# Patient Record
Sex: Female | Born: 1955 | Race: White | Hispanic: No | Marital: Married | Smoking: Never smoker
Health system: Southern US, Community
[De-identification: ages and names within clinical notes are randomized; demographics above are authoritative.]

## PROBLEM LIST (undated history)

## (undated) DIAGNOSIS — Z86718 Personal history of other venous thrombosis and embolism: Secondary | ICD-10-CM

## (undated) DIAGNOSIS — C50919 Malignant neoplasm of unspecified site of unspecified female breast: Secondary | ICD-10-CM

## (undated) DIAGNOSIS — M179 Osteoarthritis of knee, unspecified: Secondary | ICD-10-CM

## (undated) DIAGNOSIS — N3090 Cystitis, unspecified without hematuria: Secondary | ICD-10-CM

## (undated) DIAGNOSIS — M1990 Unspecified osteoarthritis, unspecified site: Secondary | ICD-10-CM

## (undated) DIAGNOSIS — M1710 Unilateral primary osteoarthritis, unspecified knee: Secondary | ICD-10-CM

## (undated) DIAGNOSIS — D759 Disease of blood and blood-forming organs, unspecified: Secondary | ICD-10-CM

## (undated) DIAGNOSIS — F329 Major depressive disorder, single episode, unspecified: Secondary | ICD-10-CM

## (undated) DIAGNOSIS — F32A Depression, unspecified: Secondary | ICD-10-CM

## (undated) DIAGNOSIS — I739 Peripheral vascular disease, unspecified: Secondary | ICD-10-CM

## (undated) DIAGNOSIS — M47817 Spondylosis without myelopathy or radiculopathy, lumbosacral region: Secondary | ICD-10-CM

## (undated) HISTORY — DX: Spondylosis without myelopathy or radiculopathy, lumbosacral region: M47.817

## (undated) HISTORY — PX: WISDOM TOOTH EXTRACTION: SHX21

## (undated) HISTORY — PX: ABDOMINAL HYSTERECTOMY: SHX81

## (undated) HISTORY — DX: Osteoarthritis of knee, unspecified: M17.9

## (undated) HISTORY — PX: REFRACTIVE SURGERY: SHX103

## (undated) HISTORY — DX: Personal history of other venous thrombosis and embolism: Z86.718

## (undated) HISTORY — DX: Unspecified osteoarthritis, unspecified site: M19.90

## (undated) HISTORY — DX: Unilateral primary osteoarthritis, unspecified knee: M17.10

## (undated) HISTORY — DX: Malignant neoplasm of unspecified site of unspecified female breast: C50.919

---

## 1980-12-11 DIAGNOSIS — N3090 Cystitis, unspecified without hematuria: Secondary | ICD-10-CM

## 1980-12-11 HISTORY — DX: Cystitis, unspecified without hematuria: N30.90

## 1981-09-10 DIAGNOSIS — N159 Renal tubulo-interstitial disease, unspecified: Secondary | ICD-10-CM | POA: Insufficient documentation

## 1992-05-11 DIAGNOSIS — Z86718 Personal history of other venous thrombosis and embolism: Secondary | ICD-10-CM

## 1992-05-11 HISTORY — DX: Personal history of other venous thrombosis and embolism: Z86.718

## 2005-09-10 HISTORY — PX: NOVASURE ABLATION: SHX5394

## 2007-01-09 ENCOUNTER — Other Ambulatory Visit: Admission: RE | Admit: 2007-01-09 | Discharge: 2007-01-09 | Payer: Self-pay | Admitting: *Deleted

## 2007-03-13 ENCOUNTER — Encounter: Admission: RE | Admit: 2007-03-13 | Discharge: 2007-03-13 | Payer: Self-pay | Admitting: *Deleted

## 2008-12-11 HISTORY — PX: PORTACATH PLACEMENT: SHX2246

## 2009-04-15 ENCOUNTER — Encounter: Admission: RE | Admit: 2009-04-15 | Discharge: 2009-04-15 | Payer: Self-pay | Admitting: Family Medicine

## 2009-04-21 ENCOUNTER — Encounter: Admission: RE | Admit: 2009-04-21 | Discharge: 2009-04-21 | Payer: Self-pay | Admitting: Family Medicine

## 2009-04-21 DIAGNOSIS — Z853 Personal history of malignant neoplasm of breast: Secondary | ICD-10-CM | POA: Insufficient documentation

## 2009-04-21 DIAGNOSIS — C50919 Malignant neoplasm of unspecified site of unspecified female breast: Secondary | ICD-10-CM

## 2009-04-21 HISTORY — DX: Malignant neoplasm of unspecified site of unspecified female breast: C50.919

## 2009-04-26 ENCOUNTER — Ambulatory Visit: Payer: Self-pay | Admitting: Oncology

## 2009-04-26 LAB — CBC WITH DIFFERENTIAL/PLATELET
Basophils Absolute: 0 10*3/uL (ref 0.0–0.1)
Eosinophils Absolute: 0.1 10*3/uL (ref 0.0–0.5)
HGB: 14 g/dL (ref 11.6–15.9)
LYMPH%: 21.1 % (ref 14.0–49.7)
MCV: 88.8 fL (ref 79.5–101.0)
MONO%: 4.9 % (ref 0.0–14.0)
NEUT#: 6.7 10*3/uL — ABNORMAL HIGH (ref 1.5–6.5)
Platelets: 286 10*3/uL (ref 145–400)

## 2009-04-27 ENCOUNTER — Encounter: Admission: RE | Admit: 2009-04-27 | Discharge: 2009-04-27 | Payer: Self-pay | Admitting: Family Medicine

## 2009-04-27 LAB — COMPREHENSIVE METABOLIC PANEL
Alkaline Phosphatase: 69 U/L (ref 39–117)
BUN: 10 mg/dL (ref 6–23)
Creatinine, Ser: 0.85 mg/dL (ref 0.40–1.20)
Glucose, Bld: 119 mg/dL — ABNORMAL HIGH (ref 70–99)
Total Bilirubin: 0.7 mg/dL (ref 0.3–1.2)

## 2009-04-27 LAB — CANCER ANTIGEN 27.29: CA 27.29: 18 U/mL (ref 0–39)

## 2009-04-28 ENCOUNTER — Ambulatory Visit: Admission: RE | Admit: 2009-04-28 | Discharge: 2009-04-28 | Payer: Self-pay | Admitting: Oncology

## 2009-04-28 ENCOUNTER — Encounter: Payer: Self-pay | Admitting: Oncology

## 2009-04-28 ENCOUNTER — Ambulatory Visit: Payer: Self-pay | Admitting: Cardiovascular Disease

## 2009-04-29 ENCOUNTER — Ambulatory Visit (HOSPITAL_COMMUNITY): Admission: RE | Admit: 2009-04-29 | Discharge: 2009-04-29 | Payer: Self-pay | Admitting: Oncology

## 2009-05-11 ENCOUNTER — Ambulatory Visit (HOSPITAL_COMMUNITY): Admission: RE | Admit: 2009-05-11 | Discharge: 2009-05-11 | Payer: Self-pay | Admitting: Oncology

## 2009-05-19 LAB — BASIC METABOLIC PANEL
Chloride: 103 mEq/L (ref 96–112)
Creatinine, Ser: 0.81 mg/dL (ref 0.40–1.20)
Potassium: 4.2 mEq/L (ref 3.5–5.3)

## 2009-05-19 LAB — CBC WITH DIFFERENTIAL/PLATELET
BASO%: 3 % — ABNORMAL HIGH (ref 0.0–2.0)
EOS%: 5 % (ref 0.0–7.0)
MCH: 29.6 pg (ref 25.1–34.0)
MCHC: 33.8 g/dL (ref 31.5–36.0)
RDW: 13.3 % (ref 11.2–14.5)
lymph#: 0.9 10*3/uL (ref 0.9–3.3)

## 2009-05-20 ENCOUNTER — Emergency Department (HOSPITAL_COMMUNITY): Admission: EM | Admit: 2009-05-20 | Discharge: 2009-05-21 | Payer: Self-pay | Admitting: Emergency Medicine

## 2009-05-24 ENCOUNTER — Other Ambulatory Visit: Admission: RE | Admit: 2009-05-24 | Discharge: 2009-05-24 | Payer: Self-pay | Admitting: Family Medicine

## 2009-05-25 LAB — CBC WITH DIFFERENTIAL/PLATELET
Basophils Absolute: 0 10*3/uL (ref 0.0–0.1)
Eosinophils Absolute: 0 10*3/uL (ref 0.0–0.5)
HGB: 13.1 g/dL (ref 11.6–15.9)
MCV: 87.9 fL (ref 79.5–101.0)
MONO#: 0.5 10*3/uL (ref 0.1–0.9)
MONO%: 5.5 % (ref 0.0–14.0)
NEUT#: 6.6 10*3/uL — ABNORMAL HIGH (ref 1.5–6.5)
RDW: 13.7 % (ref 11.2–14.5)
WBC: 8.8 10*3/uL (ref 3.9–10.3)

## 2009-05-25 LAB — COMPREHENSIVE METABOLIC PANEL
Albumin: 3.7 g/dL (ref 3.5–5.2)
Alkaline Phosphatase: 73 U/L (ref 39–117)
BUN: 8 mg/dL (ref 6–23)
CO2: 26 mEq/L (ref 19–32)
Calcium: 8.6 mg/dL (ref 8.4–10.5)
Chloride: 105 mEq/L (ref 96–112)
Glucose, Bld: 130 mg/dL — ABNORMAL HIGH (ref 70–99)
Potassium: 3.6 mEq/L (ref 3.5–5.3)
Total Protein: 6.9 g/dL (ref 6.0–8.3)

## 2009-06-02 LAB — CBC WITH DIFFERENTIAL/PLATELET
Basophils Absolute: 0 10*3/uL (ref 0.0–0.1)
EOS%: 0.3 % (ref 0.0–7.0)
Eosinophils Absolute: 0 10*3/uL (ref 0.0–0.5)
HCT: 37 % (ref 34.8–46.6)
HGB: 13.1 g/dL (ref 11.6–15.9)
LYMPH%: 19.4 % (ref 14.0–49.7)
MCH: 31.4 pg (ref 25.1–34.0)
MCV: 88.5 fL (ref 79.5–101.0)
MONO%: 1.3 % (ref 0.0–14.0)
NEUT#: 3.9 10*3/uL (ref 1.5–6.5)
NEUT%: 78.7 % — ABNORMAL HIGH (ref 38.4–76.8)
Platelets: 268 10*3/uL (ref 145–400)
RDW: 13.5 % (ref 11.2–14.5)

## 2009-06-07 ENCOUNTER — Ambulatory Visit: Payer: Self-pay | Admitting: Oncology

## 2009-06-09 LAB — CBC WITH DIFFERENTIAL/PLATELET
Basophils Absolute: 0 10*3/uL (ref 0.0–0.1)
Eosinophils Absolute: 0 10*3/uL (ref 0.0–0.5)
HCT: 35 % (ref 34.8–46.6)
HGB: 12.5 g/dL (ref 11.6–15.9)
MCV: 87.9 fL (ref 79.5–101.0)
MONO%: 7.3 % (ref 0.0–14.0)
NEUT#: 7 10*3/uL — ABNORMAL HIGH (ref 1.5–6.5)
NEUT%: 79.7 % — ABNORMAL HIGH (ref 38.4–76.8)
RDW: 14.9 % — ABNORMAL HIGH (ref 11.2–14.5)
lymph#: 1.1 10*3/uL (ref 0.9–3.3)

## 2009-06-09 LAB — COMPREHENSIVE METABOLIC PANEL
Albumin: 3.8 g/dL (ref 3.5–5.2)
Alkaline Phosphatase: 78 U/L (ref 39–117)
BUN: 12 mg/dL (ref 6–23)
Creatinine, Ser: 0.89 mg/dL (ref 0.40–1.20)
Glucose, Bld: 132 mg/dL — ABNORMAL HIGH (ref 70–99)
Potassium: 3.9 mEq/L (ref 3.5–5.3)

## 2009-06-16 LAB — CBC WITH DIFFERENTIAL/PLATELET
BASO%: 0.8 % (ref 0.0–2.0)
EOS%: 0.5 % (ref 0.0–7.0)
HCT: 34.2 % — ABNORMAL LOW (ref 34.8–46.6)
MCH: 30.8 pg (ref 25.1–34.0)
MCHC: 34.8 g/dL (ref 31.5–36.0)
MONO#: 0.1 10*3/uL (ref 0.1–0.9)
RDW: 15.4 % — ABNORMAL HIGH (ref 11.2–14.5)
WBC: 4.2 10*3/uL (ref 3.9–10.3)
lymph#: 0.4 10*3/uL — ABNORMAL LOW (ref 0.9–3.3)

## 2009-06-22 LAB — CBC WITH DIFFERENTIAL/PLATELET
Basophils Absolute: 0 10*3/uL (ref 0.0–0.1)
EOS%: 0.2 % (ref 0.0–7.0)
Eosinophils Absolute: 0 10*3/uL (ref 0.0–0.5)
HCT: 34.4 % — ABNORMAL LOW (ref 34.8–46.6)
HGB: 12.1 g/dL (ref 11.6–15.9)
MONO#: 0.7 10*3/uL (ref 0.1–0.9)
NEUT#: 3.8 10*3/uL (ref 1.5–6.5)
NEUT%: 65.7 % (ref 38.4–76.8)
RDW: 16.6 % — ABNORMAL HIGH (ref 11.2–14.5)
WBC: 5.7 10*3/uL (ref 3.9–10.3)
lymph#: 1.2 10*3/uL (ref 0.9–3.3)

## 2009-06-22 LAB — COMPREHENSIVE METABOLIC PANEL
AST: 14 U/L (ref 0–37)
Albumin: 4.1 g/dL (ref 3.5–5.2)
BUN: 15 mg/dL (ref 6–23)
CO2: 25 mEq/L (ref 19–32)
Calcium: 8.9 mg/dL (ref 8.4–10.5)
Chloride: 105 mEq/L (ref 96–112)
Glucose, Bld: 89 mg/dL (ref 70–99)
Potassium: 4 mEq/L (ref 3.5–5.3)

## 2009-06-28 ENCOUNTER — Encounter: Admission: RE | Admit: 2009-06-28 | Discharge: 2009-06-28 | Payer: Self-pay | Admitting: Oncology

## 2009-06-30 LAB — CBC WITH DIFFERENTIAL/PLATELET
BASO%: 2 % (ref 0.0–2.0)
Basophils Absolute: 0.1 10*3/uL (ref 0.0–0.1)
EOS%: 0.8 % (ref 0.0–7.0)
HGB: 11.3 g/dL — ABNORMAL LOW (ref 11.6–15.9)
MCH: 30.9 pg (ref 25.1–34.0)
MONO#: 0.2 10*3/uL (ref 0.1–0.9)
RDW: 17 % — ABNORMAL HIGH (ref 11.2–14.5)
WBC: 3.4 10*3/uL — ABNORMAL LOW (ref 3.9–10.3)
lymph#: 0.6 10*3/uL — ABNORMAL LOW (ref 0.9–3.3)

## 2009-07-02 ENCOUNTER — Ambulatory Visit: Payer: Self-pay | Admitting: Oncology

## 2009-07-07 LAB — COMPREHENSIVE METABOLIC PANEL WITH GFR
ALT: 13 U/L (ref 0–35)
AST: 13 U/L (ref 0–37)
Albumin: 4.3 g/dL (ref 3.5–5.2)
Alkaline Phosphatase: 85 U/L (ref 39–117)
BUN: 15 mg/dL (ref 6–23)
CO2: 19 meq/L (ref 19–32)
Calcium: 9.3 mg/dL (ref 8.4–10.5)
Chloride: 102 meq/L (ref 96–112)
Creatinine, Ser: 0.76 mg/dL (ref 0.40–1.20)
Glucose, Bld: 118 mg/dL — ABNORMAL HIGH (ref 70–99)
Potassium: 4 meq/L (ref 3.5–5.3)
Sodium: 135 meq/L (ref 135–145)
Total Bilirubin: 0.6 mg/dL (ref 0.3–1.2)
Total Protein: 7 g/dL (ref 6.0–8.3)

## 2009-07-07 LAB — CBC WITH DIFFERENTIAL/PLATELET
BASO%: 0.4 % (ref 0.0–2.0)
EOS%: 0.1 % (ref 0.0–7.0)
HCT: 35 % (ref 34.8–46.6)
LYMPH%: 2.5 % — ABNORMAL LOW (ref 14.0–49.7)
MCH: 31.4 pg (ref 25.1–34.0)
MCHC: 34.9 g/dL (ref 31.5–36.0)
MCV: 89.9 fL (ref 79.5–101.0)
MONO%: 2.2 % (ref 0.0–14.0)
NEUT%: 94.8 % — ABNORMAL HIGH (ref 38.4–76.8)
Platelets: 234 10*3/uL (ref 145–400)

## 2009-07-16 LAB — CBC WITH DIFFERENTIAL/PLATELET
Basophils Absolute: 0 10*3/uL (ref 0.0–0.1)
EOS%: 0 % (ref 0.0–7.0)
HCT: 35.5 % (ref 34.8–46.6)
HGB: 12.1 g/dL (ref 11.6–15.9)
LYMPH%: 5.8 % — ABNORMAL LOW (ref 14.0–49.7)
MCH: 30.6 pg (ref 25.1–34.0)
MCHC: 34.1 g/dL (ref 31.5–36.0)
MCV: 89.8 fL (ref 79.5–101.0)
NEUT%: 89.9 % — ABNORMAL HIGH (ref 38.4–76.8)
Platelets: 215 10*3/uL (ref 145–400)
lymph#: 1.3 10*3/uL (ref 0.9–3.3)

## 2009-07-28 LAB — CBC WITH DIFFERENTIAL/PLATELET
BASO%: 0.1 % (ref 0.0–2.0)
Basophils Absolute: 0 10*3/uL (ref 0.0–0.1)
EOS%: 0 % (ref 0.0–7.0)
HGB: 11.4 g/dL — ABNORMAL LOW (ref 11.6–15.9)
MCH: 30.2 pg (ref 25.1–34.0)
MCHC: 33.7 g/dL (ref 31.5–36.0)
MCV: 89.4 fL (ref 79.5–101.0)
MONO%: 3.9 % (ref 0.0–14.0)
NEUT%: 92.5 % — ABNORMAL HIGH (ref 38.4–76.8)
RDW: 17.3 % — ABNORMAL HIGH (ref 11.2–14.5)

## 2009-07-28 LAB — COMPREHENSIVE METABOLIC PANEL
AST: 12 U/L (ref 0–37)
Alkaline Phosphatase: 63 U/L (ref 39–117)
BUN: 17 mg/dL (ref 6–23)
Creatinine, Ser: 0.72 mg/dL (ref 0.40–1.20)
Potassium: 4.1 mEq/L (ref 3.5–5.3)

## 2009-08-02 ENCOUNTER — Ambulatory Visit: Payer: Self-pay | Admitting: Oncology

## 2009-08-04 LAB — CBC WITH DIFFERENTIAL/PLATELET
BASO%: 1.6 % (ref 0.0–2.0)
EOS%: 1.4 % (ref 0.0–7.0)
HCT: 33.3 % — ABNORMAL LOW (ref 34.8–46.6)
LYMPH%: 19 % (ref 14.0–49.7)
MCH: 29.8 pg (ref 25.1–34.0)
MCHC: 33.3 g/dL (ref 31.5–36.0)
MONO#: 0.8 10*3/uL (ref 0.1–0.9)
NEUT%: 63.2 % (ref 38.4–76.8)
RBC: 3.73 10*6/uL (ref 3.70–5.45)
WBC: 5.7 10*3/uL (ref 3.9–10.3)
lymph#: 1.1 10*3/uL (ref 0.9–3.3)

## 2009-08-11 LAB — COMPREHENSIVE METABOLIC PANEL WITH GFR
ALT: 15 U/L (ref 0–35)
AST: 12 U/L (ref 0–37)
Albumin: 4.2 g/dL (ref 3.5–5.2)
Alkaline Phosphatase: 75 U/L (ref 39–117)
BUN: 20 mg/dL (ref 6–23)
CO2: 19 meq/L (ref 19–32)
Calcium: 9.1 mg/dL (ref 8.4–10.5)
Chloride: 106 meq/L (ref 96–112)
Creatinine, Ser: 0.71 mg/dL (ref 0.40–1.20)
Glucose, Bld: 147 mg/dL — ABNORMAL HIGH (ref 70–99)
Potassium: 4.3 meq/L (ref 3.5–5.3)
Sodium: 137 meq/L (ref 135–145)
Total Bilirubin: 0.7 mg/dL (ref 0.3–1.2)
Total Protein: 6.9 g/dL (ref 6.0–8.3)

## 2009-08-11 LAB — CBC WITH DIFFERENTIAL/PLATELET
BASO%: 0.1 % (ref 0.0–2.0)
Basophils Absolute: 0 10e3/uL (ref 0.0–0.1)
EOS%: 0 % (ref 0.0–7.0)
Eosinophils Absolute: 0 10e3/uL (ref 0.0–0.5)
HCT: 35.6 % (ref 34.8–46.6)
HGB: 11.9 g/dL (ref 11.6–15.9)
LYMPH%: 3 % — ABNORMAL LOW (ref 14.0–49.7)
MCH: 30.3 pg (ref 25.1–34.0)
MCHC: 33.4 g/dL (ref 31.5–36.0)
MCV: 90.6 fL (ref 79.5–101.0)
MONO#: 0.3 10e3/uL (ref 0.1–0.9)
MONO%: 2.1 % (ref 0.0–14.0)
NEUT#: 13.5 10e3/uL — ABNORMAL HIGH (ref 1.5–6.5)
NEUT%: 94.8 % — ABNORMAL HIGH (ref 38.4–76.8)
Platelets: 199 10e3/uL (ref 145–400)
RBC: 3.93 10e6/uL (ref 3.70–5.45)
RDW: 16.3 % — ABNORMAL HIGH (ref 11.2–14.5)
WBC: 14.3 10e3/uL — ABNORMAL HIGH (ref 3.9–10.3)
lymph#: 0.4 10e3/uL — ABNORMAL LOW (ref 0.9–3.3)

## 2009-08-18 LAB — CBC WITH DIFFERENTIAL/PLATELET
BASO%: 1.4 % (ref 0.0–2.0)
Eosinophils Absolute: 0 10*3/uL (ref 0.0–0.5)
MONO#: 1.3 10*3/uL — ABNORMAL HIGH (ref 0.1–0.9)
MONO%: 17.7 % — ABNORMAL HIGH (ref 0.0–14.0)
NEUT#: 4.9 10*3/uL (ref 1.5–6.5)
RBC: 3.69 10*6/uL — ABNORMAL LOW (ref 3.70–5.45)
RDW: 15.8 % — ABNORMAL HIGH (ref 11.2–14.5)
WBC: 7.2 10*3/uL (ref 3.9–10.3)

## 2009-08-25 LAB — COMPREHENSIVE METABOLIC PANEL
Alkaline Phosphatase: 86 U/L (ref 39–117)
CO2: 20 mEq/L (ref 19–32)
Creatinine, Ser: 0.64 mg/dL (ref 0.40–1.20)
Glucose, Bld: 137 mg/dL — ABNORMAL HIGH (ref 70–99)
Sodium: 136 mEq/L (ref 135–145)
Total Bilirubin: 0.5 mg/dL (ref 0.3–1.2)
Total Protein: 6.8 g/dL (ref 6.0–8.3)

## 2009-08-25 LAB — CBC WITH DIFFERENTIAL/PLATELET
Eosinophils Absolute: 0 10*3/uL (ref 0.0–0.5)
HCT: 35.3 % (ref 34.8–46.6)
LYMPH%: 3.3 % — ABNORMAL LOW (ref 14.0–49.7)
MONO#: 0.3 10*3/uL (ref 0.1–0.9)
NEUT#: 14 10*3/uL — ABNORMAL HIGH (ref 1.5–6.5)
Platelets: 241 10*3/uL (ref 145–400)
RBC: 3.95 10*6/uL (ref 3.70–5.45)
WBC: 14.8 10*3/uL — ABNORMAL HIGH (ref 3.9–10.3)
lymph#: 0.5 10*3/uL — ABNORMAL LOW (ref 0.9–3.3)

## 2009-08-30 ENCOUNTER — Encounter: Admission: RE | Admit: 2009-08-30 | Discharge: 2009-08-30 | Payer: Self-pay | Admitting: Oncology

## 2009-08-30 ENCOUNTER — Ambulatory Visit: Admission: RE | Admit: 2009-08-30 | Discharge: 2009-11-17 | Payer: Self-pay | Admitting: Radiation Oncology

## 2009-09-01 ENCOUNTER — Ambulatory Visit: Payer: Self-pay | Admitting: Oncology

## 2009-09-01 LAB — CBC WITH DIFFERENTIAL/PLATELET
Basophils Absolute: 0.1 10*3/uL (ref 0.0–0.1)
Eosinophils Absolute: 0 10*3/uL (ref 0.0–0.5)
HCT: 34.3 % — ABNORMAL LOW (ref 34.8–46.6)
LYMPH%: 9.3 % — ABNORMAL LOW (ref 14.0–49.7)
MCV: 89.1 fL (ref 79.5–101.0)
MONO#: 1.3 10*3/uL — ABNORMAL HIGH (ref 0.1–0.9)
MONO%: 16.1 % — ABNORMAL HIGH (ref 0.0–14.0)
NEUT#: 6.1 10*3/uL (ref 1.5–6.5)
NEUT%: 73.7 % (ref 38.4–76.8)
Platelets: 228 10*3/uL (ref 145–400)
WBC: 8.3 10*3/uL (ref 3.9–10.3)
nRBC: 0 % (ref 0–0)

## 2009-09-22 ENCOUNTER — Encounter (INDEPENDENT_AMBULATORY_CARE_PROVIDER_SITE_OTHER): Payer: Self-pay | Admitting: Surgery

## 2009-09-22 ENCOUNTER — Ambulatory Visit (HOSPITAL_COMMUNITY): Admission: RE | Admit: 2009-09-22 | Discharge: 2009-09-23 | Payer: Self-pay | Admitting: Surgery

## 2009-09-22 HISTORY — PX: MASTECTOMY: SHX3

## 2009-10-01 ENCOUNTER — Ambulatory Visit: Payer: Self-pay | Admitting: Oncology

## 2009-10-05 LAB — CBC WITH DIFFERENTIAL/PLATELET
Basophils Absolute: 0 10*3/uL (ref 0.0–0.1)
Eosinophils Absolute: 0.2 10*3/uL (ref 0.0–0.5)
HCT: 36.5 % (ref 34.8–46.6)
HGB: 12.3 g/dL (ref 11.6–15.9)
LYMPH%: 20.7 % (ref 14.0–49.7)
MONO#: 0.4 10*3/uL (ref 0.1–0.9)
NEUT#: 3.3 10*3/uL (ref 1.5–6.5)
NEUT%: 66.1 % (ref 38.4–76.8)
Platelets: 265 10*3/uL (ref 145–400)
RBC: 4.1 10*6/uL (ref 3.70–5.45)
WBC: 5 10*3/uL (ref 3.9–10.3)

## 2009-10-05 LAB — COMPREHENSIVE METABOLIC PANEL
CO2: 25 mEq/L (ref 19–32)
Glucose, Bld: 101 mg/dL — ABNORMAL HIGH (ref 70–99)
Sodium: 140 mEq/L (ref 135–145)
Total Bilirubin: 0.6 mg/dL (ref 0.3–1.2)
Total Protein: 6.3 g/dL (ref 6.0–8.3)

## 2009-10-20 LAB — CBC WITH DIFFERENTIAL/PLATELET
BASO%: 0.8 % (ref 0.0–2.0)
EOS%: 4.4 % (ref 0.0–7.0)
LYMPH%: 26.9 % (ref 14.0–49.7)
MCH: 29.7 pg (ref 25.1–34.0)
MCHC: 33.6 g/dL (ref 31.5–36.0)
MCV: 88.3 fL (ref 79.5–101.0)
MONO%: 8.2 % (ref 0.0–14.0)
Platelets: 217 10*3/uL (ref 145–400)
RBC: 4.37 10*6/uL (ref 3.70–5.45)
RDW: 15.1 % — ABNORMAL HIGH (ref 11.2–14.5)

## 2009-11-08 ENCOUNTER — Ambulatory Visit: Payer: Self-pay | Admitting: Oncology

## 2009-11-10 LAB — COMPREHENSIVE METABOLIC PANEL
ALT: 13 U/L (ref 0–35)
AST: 15 U/L (ref 0–37)
Albumin: 4 g/dL (ref 3.5–5.2)
Alkaline Phosphatase: 102 U/L (ref 39–117)
Chloride: 102 mEq/L (ref 96–112)
Potassium: 4.3 mEq/L (ref 3.5–5.3)
Sodium: 139 mEq/L (ref 135–145)
Total Protein: 6.2 g/dL (ref 6.0–8.3)

## 2009-11-10 LAB — CBC WITH DIFFERENTIAL/PLATELET
BASO%: 0.4 % (ref 0.0–2.0)
EOS%: 2.1 % (ref 0.0–7.0)
MCH: 30 pg (ref 25.1–34.0)
MCV: 87.9 fL (ref 79.5–101.0)
MONO%: 9.1 % (ref 0.0–14.0)
NEUT#: 2.6 10*3/uL (ref 1.5–6.5)
RBC: 4.29 10*6/uL (ref 3.70–5.45)
RDW: 15.1 % — ABNORMAL HIGH (ref 11.2–14.5)
lymph#: 0.9 10*3/uL (ref 0.9–3.3)

## 2009-11-17 ENCOUNTER — Ambulatory Visit: Admission: RE | Admit: 2009-11-17 | Discharge: 2009-12-10 | Payer: Self-pay | Admitting: Radiation Oncology

## 2009-11-17 LAB — CBC WITH DIFFERENTIAL/PLATELET
Basophils Absolute: 0 10*3/uL (ref 0.0–0.1)
EOS%: 2.6 % (ref 0.0–7.0)
Eosinophils Absolute: 0.1 10*3/uL (ref 0.0–0.5)
HCT: 38 % (ref 34.8–46.6)
HGB: 12.8 g/dL (ref 11.6–15.9)
MCH: 29.9 pg (ref 25.1–34.0)
NEUT#: 2.5 10*3/uL (ref 1.5–6.5)
NEUT%: 65.6 % (ref 38.4–76.8)
lymph#: 0.8 10*3/uL — ABNORMAL LOW (ref 0.9–3.3)

## 2009-11-24 LAB — CBC WITH DIFFERENTIAL/PLATELET
Basophils Absolute: 0 10*3/uL (ref 0.0–0.1)
EOS%: 2.9 % (ref 0.0–7.0)
Eosinophils Absolute: 0.1 10*3/uL (ref 0.0–0.5)
HCT: 37 % (ref 34.8–46.6)
HGB: 12.7 g/dL (ref 11.6–15.9)
LYMPH%: 21.4 % (ref 14.0–49.7)
MCH: 30.3 pg (ref 25.1–34.0)
MCV: 88.6 fL (ref 79.5–101.0)
MONO%: 12.5 % (ref 0.0–14.0)
NEUT%: 62.5 % (ref 38.4–76.8)
Platelets: 208 10*3/uL (ref 145–400)
RDW: 17.5 % — ABNORMAL HIGH (ref 11.2–14.5)

## 2009-12-01 LAB — CBC WITH DIFFERENTIAL/PLATELET
EOS%: 2.8 % (ref 0.0–7.0)
LYMPH%: 18.8 % (ref 14.0–49.7)
MCH: 31.1 pg (ref 25.1–34.0)
MCV: 88.8 fL (ref 79.5–101.0)
MONO%: 12.2 % (ref 0.0–14.0)
Platelets: 221 10*3/uL (ref 145–400)
RBC: 4.2 10*6/uL (ref 3.70–5.45)
RDW: 18.4 % — ABNORMAL HIGH (ref 11.2–14.5)

## 2009-12-08 ENCOUNTER — Ambulatory Visit: Payer: Self-pay | Admitting: Oncology

## 2009-12-08 ENCOUNTER — Other Ambulatory Visit: Payer: Self-pay | Admitting: Physician Assistant

## 2009-12-08 LAB — COMPREHENSIVE METABOLIC PANEL
ALT: 16 U/L (ref 0–35)
Albumin: 3.7 g/dL (ref 3.5–5.2)
Alkaline Phosphatase: 112 U/L (ref 39–117)
Glucose, Bld: 99 mg/dL (ref 70–99)
Potassium: 4.3 mEq/L (ref 3.5–5.3)
Sodium: 140 mEq/L (ref 135–145)
Total Bilirubin: 0.7 mg/dL (ref 0.3–1.2)
Total Protein: 6.1 g/dL (ref 6.0–8.3)

## 2009-12-08 LAB — CBC WITH DIFFERENTIAL/PLATELET
BASO%: 0.5 % (ref 0.0–2.0)
Eosinophils Absolute: 0.1 10*3/uL (ref 0.0–0.5)
LYMPH%: 13.7 % — ABNORMAL LOW (ref 14.0–49.7)
MCHC: 34.8 g/dL (ref 31.5–36.0)
MCV: 89.8 fL (ref 79.5–101.0)
MONO#: 0.4 10*3/uL (ref 0.1–0.9)
MONO%: 10 % (ref 0.0–14.0)
NEUT#: 3 10*3/uL (ref 1.5–6.5)
Platelets: 206 10*3/uL (ref 145–400)
RBC: 4.1 10*6/uL (ref 3.70–5.45)
RDW: 18.7 % — ABNORMAL HIGH (ref 11.2–14.5)
WBC: 4.1 10*3/uL (ref 3.9–10.3)

## 2009-12-13 ENCOUNTER — Ambulatory Visit: Admission: RE | Admit: 2009-12-13 | Discharge: 2009-12-14 | Payer: Self-pay | Admitting: Radiation Oncology

## 2009-12-15 LAB — CBC WITH DIFFERENTIAL/PLATELET
BASO%: 0.5 % (ref 0.0–2.0)
Basophils Absolute: 0 10*3/uL (ref 0.0–0.1)
HCT: 37.6 % (ref 34.8–46.6)
HGB: 12.9 g/dL (ref 11.6–15.9)
MONO#: 0.3 10*3/uL (ref 0.1–0.9)
NEUT%: 70 % (ref 38.4–76.8)
WBC: 4.2 10*3/uL (ref 3.9–10.3)
lymph#: 0.8 10*3/uL — ABNORMAL LOW (ref 0.9–3.3)

## 2009-12-15 LAB — COMPREHENSIVE METABOLIC PANEL
ALT: 13 U/L (ref 0–35)
Albumin: 4.1 g/dL (ref 3.5–5.2)
BUN: 15 mg/dL (ref 6–23)
CO2: 23 mEq/L (ref 19–32)
Calcium: 8.4 mg/dL (ref 8.4–10.5)
Chloride: 104 mEq/L (ref 96–112)
Creatinine, Ser: 0.8 mg/dL (ref 0.40–1.20)
Potassium: 4 mEq/L (ref 3.5–5.3)

## 2009-12-15 LAB — FOLLICLE STIMULATING HORMONE: FSH: 46.9 m[IU]/mL

## 2009-12-22 LAB — ESTRADIOL, ULTRA SENS

## 2009-12-29 LAB — CBC WITH DIFFERENTIAL/PLATELET
BASO%: 0.5 % (ref 0.0–2.0)
Basophils Absolute: 0 10e3/uL (ref 0.0–0.1)
EOS%: 2.6 % (ref 0.0–7.0)
Eosinophils Absolute: 0.1 10e3/uL (ref 0.0–0.5)
HCT: 37.4 % (ref 34.8–46.6)
HGB: 13 g/dL (ref 11.6–15.9)
LYMPH%: 18.4 % (ref 14.0–49.7)
MCH: 31.6 pg (ref 25.1–34.0)
MCHC: 34.7 g/dL (ref 31.5–36.0)
MCV: 91.1 fL (ref 79.5–101.0)
MONO#: 0.3 10e3/uL (ref 0.1–0.9)
MONO%: 8.5 % (ref 0.0–14.0)
NEUT#: 2.6 10e3/uL (ref 1.5–6.5)
NEUT%: 70 % (ref 38.4–76.8)
Platelets: 224 10e3/uL (ref 145–400)
RBC: 4.1 10e6/uL (ref 3.70–5.45)
RDW: 18.2 % — ABNORMAL HIGH (ref 11.2–14.5)
WBC: 3.7 10e3/uL — ABNORMAL LOW (ref 3.9–10.3)
lymph#: 0.7 10e3/uL — ABNORMAL LOW (ref 0.9–3.3)

## 2010-01-05 LAB — COMPREHENSIVE METABOLIC PANEL
ALT: 16 U/L (ref 0–35)
AST: 17 U/L (ref 0–37)
Alkaline Phosphatase: 107 U/L (ref 39–117)
BUN: 13 mg/dL (ref 6–23)
Creatinine, Ser: 0.8 mg/dL (ref 0.40–1.20)
Total Bilirubin: 0.5 mg/dL (ref 0.3–1.2)

## 2010-01-05 LAB — HYPERCOAGULABLE PANEL, COMPREHENSIVE
Anticardiolipin IgG: <10 GPL U/mL (ref <10)
Anticardiolipin IgM: <10 MPL U/mL (ref <10)
Beta-2 Glyco I IgG: 9 U/mL (ref <=15)
Beta-2-Glycoprotein I IgM: 122 U/mL — ABNORMAL HIGH (ref <=15)
Drvvt confirmation: 1.18 Ratio (ref <1.21)
PTT Lupus Anticoagulant: 52.1 secs — ABNORMAL HIGH (ref 32.0–43.4)
PTTLA 4:1 Mix: 46.6 secs (ref 36.3–48.8)
Protein C, Total: 108 % (ref 70–140)
Protein S Ag, Total: 108 % (ref 70–140)

## 2010-01-05 LAB — LACTATE DEHYDROGENASE: LDH: 143 U/L (ref 94–250)

## 2010-01-24 ENCOUNTER — Ambulatory Visit: Payer: Self-pay | Admitting: Oncology

## 2010-01-26 LAB — CBC WITH DIFFERENTIAL/PLATELET
BASO%: 0.5 % (ref 0.0–2.0)
Eosinophils Absolute: 0.2 10*3/uL (ref 0.0–0.5)
LYMPH%: 18.7 % (ref 14.0–49.7)
MCHC: 34.7 g/dL (ref 31.5–36.0)
MONO#: 0.4 10*3/uL (ref 0.1–0.9)
NEUT#: 3.5 10*3/uL (ref 1.5–6.5)
RBC: 4.14 10*6/uL (ref 3.70–5.45)
RDW: 15.6 % — ABNORMAL HIGH (ref 11.2–14.5)
WBC: 5.1 10*3/uL (ref 3.9–10.3)

## 2010-01-26 LAB — COMPREHENSIVE METABOLIC PANEL
ALT: 27 U/L (ref 0–35)
Albumin: 4.1 g/dL (ref 3.5–5.2)
Alkaline Phosphatase: 120 U/L — ABNORMAL HIGH (ref 39–117)
CO2: 24 mEq/L (ref 19–32)
Glucose, Bld: 101 mg/dL — ABNORMAL HIGH (ref 70–99)
Potassium: 4.4 mEq/L (ref 3.5–5.3)
Sodium: 139 mEq/L (ref 135–145)
Total Bilirubin: 0.7 mg/dL (ref 0.3–1.2)
Total Protein: 6.8 g/dL (ref 6.0–8.3)

## 2010-02-21 ENCOUNTER — Ambulatory Visit: Payer: Self-pay | Admitting: Oncology

## 2010-02-23 LAB — COMPREHENSIVE METABOLIC PANEL
ALT: 15 U/L (ref 0–35)
Albumin: 4.1 g/dL (ref 3.5–5.2)
CO2: 26 mEq/L (ref 19–32)
Calcium: 9.4 mg/dL (ref 8.4–10.5)
Chloride: 103 mEq/L (ref 96–112)
Glucose, Bld: 91 mg/dL (ref 70–99)
Potassium: 4.5 mEq/L (ref 3.5–5.3)
Sodium: 138 mEq/L (ref 135–145)
Total Bilirubin: 0.9 mg/dL (ref 0.3–1.2)
Total Protein: 6.9 g/dL (ref 6.0–8.3)

## 2010-02-23 LAB — CBC WITH DIFFERENTIAL/PLATELET
BASO%: 0.6 % (ref 0.0–2.0)
Eosinophils Absolute: 0.1 10*3/uL (ref 0.0–0.5)
LYMPH%: 18.9 % (ref 14.0–49.7)
MONO#: 0.4 10*3/uL (ref 0.1–0.9)
NEUT#: 3.1 10*3/uL (ref 1.5–6.5)
Platelets: 218 10*3/uL (ref 145–400)
RBC: 4.28 10*6/uL (ref 3.70–5.45)
WBC: 4.4 10*3/uL (ref 3.9–10.3)
lymph#: 0.8 10*3/uL — ABNORMAL LOW (ref 0.9–3.3)

## 2010-03-03 ENCOUNTER — Other Ambulatory Visit: Admission: RE | Admit: 2010-03-03 | Discharge: 2010-03-03 | Payer: Self-pay | Admitting: *Deleted

## 2010-03-14 LAB — COMPREHENSIVE METABOLIC PANEL
Albumin: 4.1 g/dL (ref 3.5–5.2)
Alkaline Phosphatase: 113 U/L (ref 39–117)
BUN: 18 mg/dL (ref 6–23)
CO2: 23 mEq/L (ref 19–32)
Calcium: 8.9 mg/dL (ref 8.4–10.5)
Chloride: 106 mEq/L (ref 96–112)
Glucose, Bld: 98 mg/dL (ref 70–99)
Potassium: 4.1 mEq/L (ref 3.5–5.3)
Sodium: 140 mEq/L (ref 135–145)
Total Protein: 6.6 g/dL (ref 6.0–8.3)

## 2010-03-14 LAB — CBC WITH DIFFERENTIAL/PLATELET
Basophils Absolute: 0 10*3/uL (ref 0.0–0.1)
EOS%: 2.2 % (ref 0.0–7.0)
Eosinophils Absolute: 0.1 10*3/uL (ref 0.0–0.5)
HCT: 36.5 % (ref 34.8–46.6)
HGB: 12.4 g/dL (ref 11.6–15.9)
MCH: 29.5 pg (ref 25.1–34.0)
NEUT#: 2.6 10*3/uL (ref 1.5–6.5)
NEUT%: 63.3 % (ref 38.4–76.8)
RDW: 13 % (ref 11.2–14.5)
lymph#: 1 10*3/uL (ref 0.9–3.3)

## 2010-03-14 LAB — CANCER ANTIGEN 27.29: CA 27.29: 12 U/mL (ref 0–39)

## 2010-03-14 LAB — LACTATE DEHYDROGENASE: LDH: 166 U/L (ref 94–250)

## 2010-03-14 LAB — LUTEINIZING HORMONE: LH: <0.1 m[IU]/mL

## 2010-03-22 LAB — ESTRADIOL, ULTRA SENS

## 2010-03-23 ENCOUNTER — Ambulatory Visit: Payer: Self-pay | Admitting: Oncology

## 2010-04-22 ENCOUNTER — Ambulatory Visit: Payer: Self-pay | Admitting: Oncology

## 2010-05-23 ENCOUNTER — Ambulatory Visit: Payer: Self-pay | Admitting: Oncology

## 2010-06-06 LAB — COMPREHENSIVE METABOLIC PANEL
ALT: 15 U/L (ref 0–35)
AST: 15 U/L (ref 0–37)
Albumin: 4 g/dL (ref 3.5–5.2)
Alkaline Phosphatase: 112 U/L (ref 39–117)
Calcium: 9.1 mg/dL (ref 8.4–10.5)
Chloride: 105 mEq/L (ref 96–112)
Creatinine, Ser: 0.9 mg/dL (ref 0.40–1.20)
Potassium: 4.1 mEq/L (ref 3.5–5.3)

## 2010-06-06 LAB — CBC WITH DIFFERENTIAL/PLATELET
BASO%: 0.5 % (ref 0.0–2.0)
EOS%: 3.5 % (ref 0.0–7.0)
MCH: 30.9 pg (ref 25.1–34.0)
MCHC: 34.7 g/dL (ref 31.5–36.0)
MCV: 89.1 fL (ref 79.5–101.0)
MONO%: 6.9 % (ref 0.0–14.0)
RDW: 15.2 % — ABNORMAL HIGH (ref 11.2–14.5)
lymph#: 0.8 10*3/uL — ABNORMAL LOW (ref 0.9–3.3)

## 2010-06-16 LAB — ESTRADIOL, ULTRA SENS: Estradiol, Ultra Sensitive: 2 pg/mL

## 2010-06-22 ENCOUNTER — Ambulatory Visit: Payer: Self-pay | Admitting: Oncology

## 2010-07-07 ENCOUNTER — Ambulatory Visit (HOSPITAL_BASED_OUTPATIENT_CLINIC_OR_DEPARTMENT_OTHER): Admission: RE | Admit: 2010-07-07 | Discharge: 2010-07-07 | Payer: Self-pay | Admitting: Surgery

## 2010-07-07 HISTORY — PX: PORT-A-CATH REMOVAL: SHX5289

## 2010-10-11 ENCOUNTER — Ambulatory Visit: Payer: Self-pay | Admitting: Oncology

## 2010-10-13 LAB — CBC WITH DIFFERENTIAL/PLATELET
BASO%: 0.5 % (ref 0.0–2.0)
LYMPH%: 21.8 % (ref 14.0–49.7)
MCHC: 33.6 g/dL (ref 31.5–36.0)
MCV: 89.3 fL (ref 79.5–101.0)
MONO%: 7.5 % (ref 0.0–14.0)
NEUT#: 3.3 10*3/uL (ref 1.5–6.5)
Platelets: 226 10*3/uL (ref 145–400)
RBC: 4.32 10*6/uL (ref 3.70–5.45)
RDW: 14.2 % (ref 11.2–14.5)
WBC: 4.8 10*3/uL (ref 3.9–10.3)

## 2010-10-13 LAB — COMPREHENSIVE METABOLIC PANEL
ALT: 23 U/L (ref 0–35)
AST: 23 U/L (ref 0–37)
Albumin: 4.6 g/dL (ref 3.5–5.2)
Alkaline Phosphatase: 121 U/L — ABNORMAL HIGH (ref 39–117)
Potassium: 4.6 mEq/L (ref 3.5–5.3)
Sodium: 140 mEq/L (ref 135–145)
Total Bilirubin: 0.8 mg/dL (ref 0.3–1.2)
Total Protein: 7.1 g/dL (ref 6.0–8.3)

## 2010-11-26 IMAGING — CT CT CHEST W/ CM
1 of 3 series · 13 of 31 positions shown, 17 images · IV contrast (agent unspecified)
Comparison: PET CT done concurrently. Breast MRI 04/27/2009

CT CHEST

CLINICAL DATA: New diagnosis of right breast cancer.  Cough.

CT CHEST, ABDOMEN AND PELVIS WITH CONTRAST
TECHNIQUE: Multidetector CT imaging of the chest, abdomen and
pelvis was performed following the standard protocol during bolus
administration of intravenous contrast.
Contrast: 100 ml 8mnipaque-AVV intravenously.

[Series 2: cap with st · axial · 0.84mm/px · z∈[+1288,+1828]mm · 13 of 126 slices shown, 17 images]
[im 9/126  mediastinal]
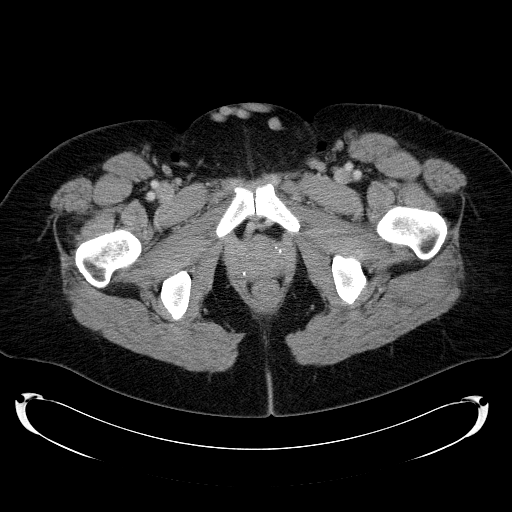
[im 9/126  lung]
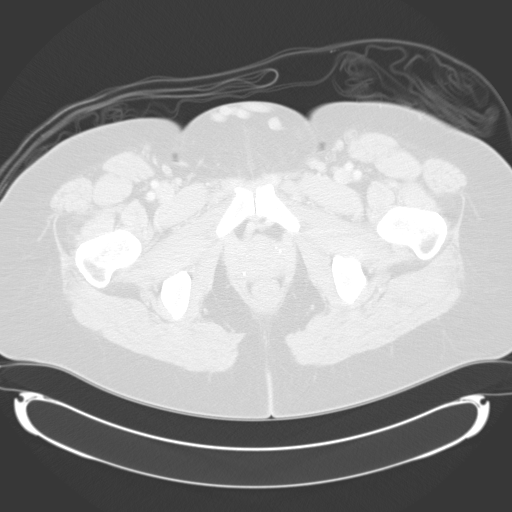
[im 17/126  lung]
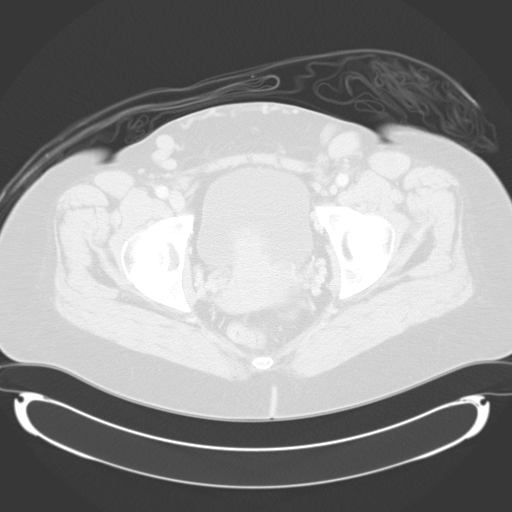
[im 34/126  lung]
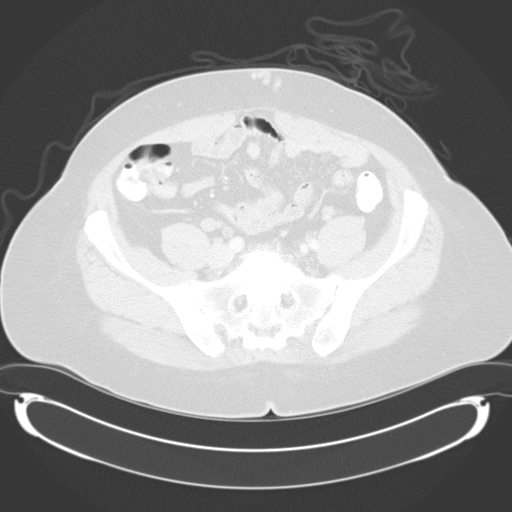
[im 42/126  lung]
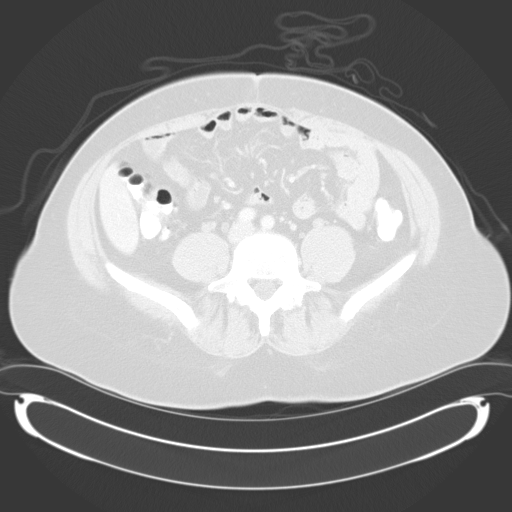
[im 51/126  mediastinal]
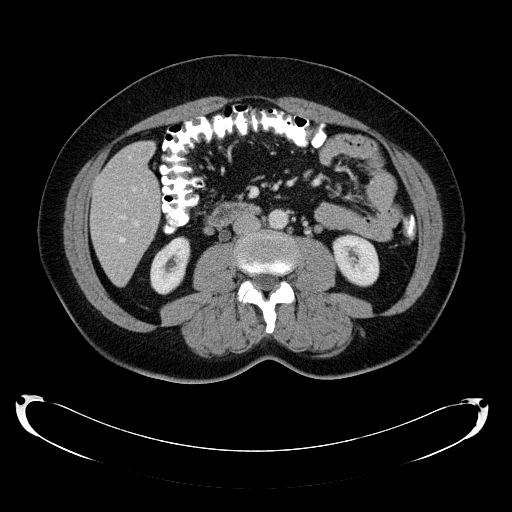
[im 51/126  lung]
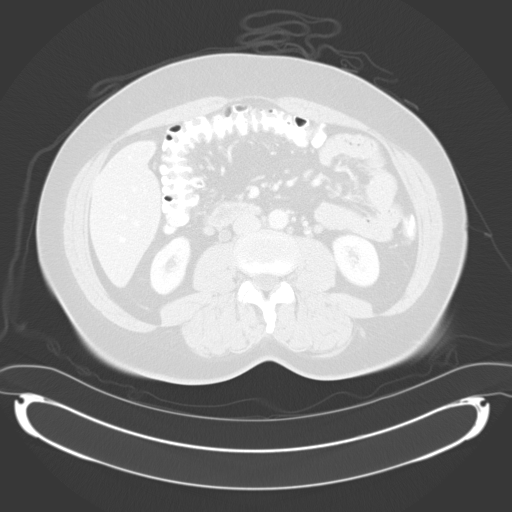
[im 59/126  lung]
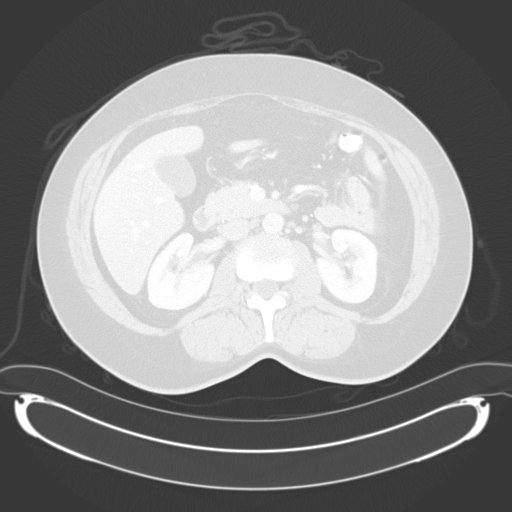
[im 63/126  lung]
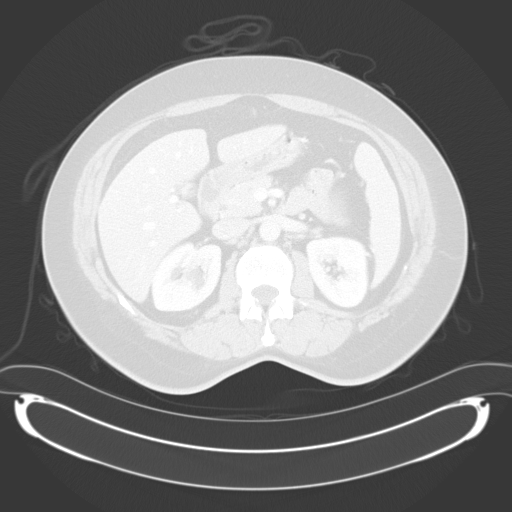
[im 67/126  lung]
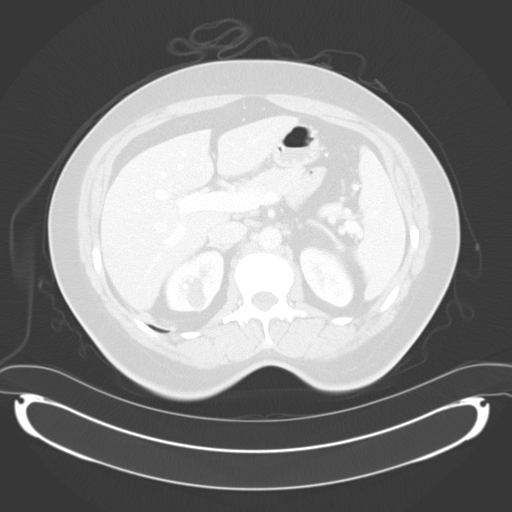
[im 76/126  mediastinal]
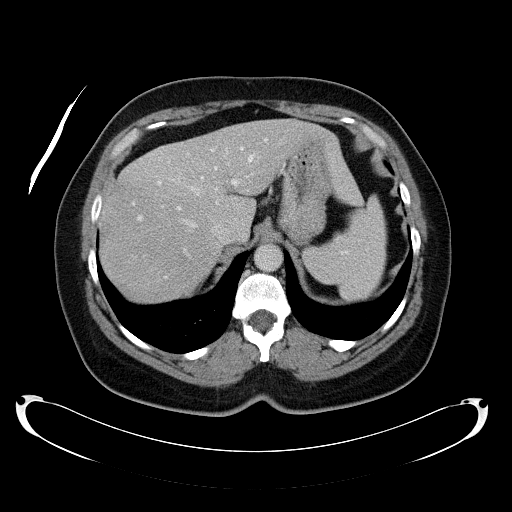
[im 76/126  lung]
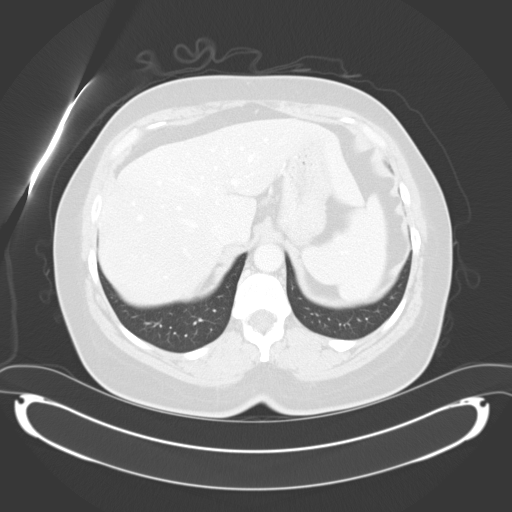
[im 84/126  lung]
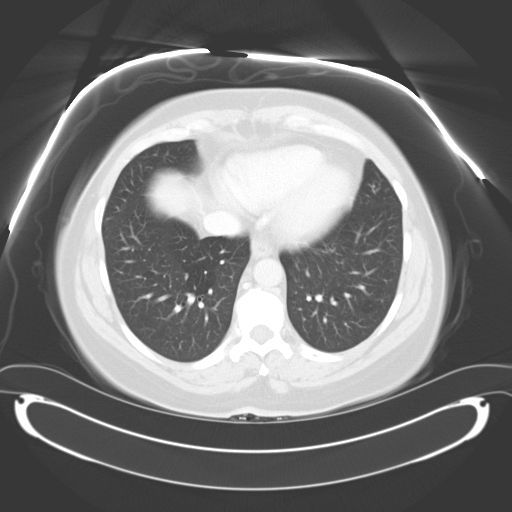
[im 92/126  lung]
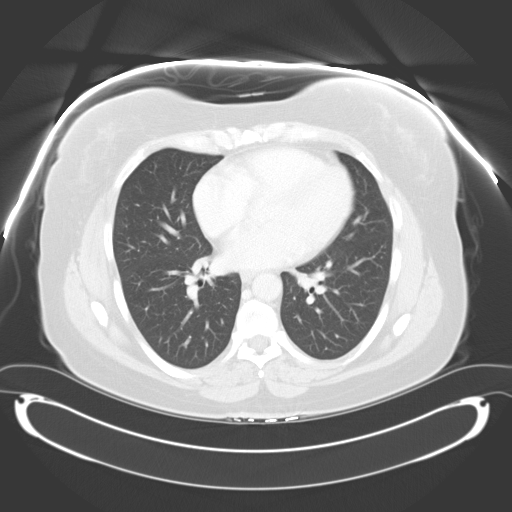
[im 109/126  lung]
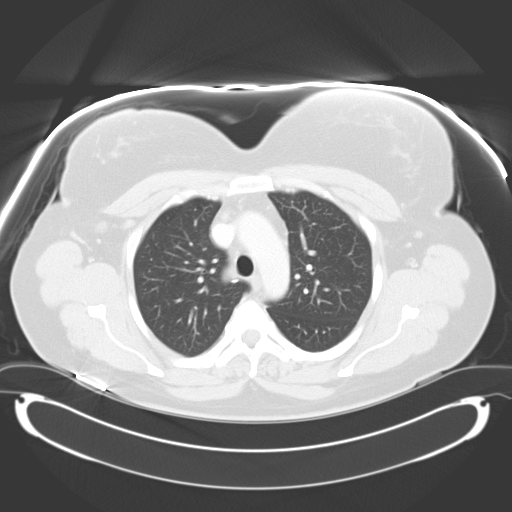
[im 117/126  mediastinal]
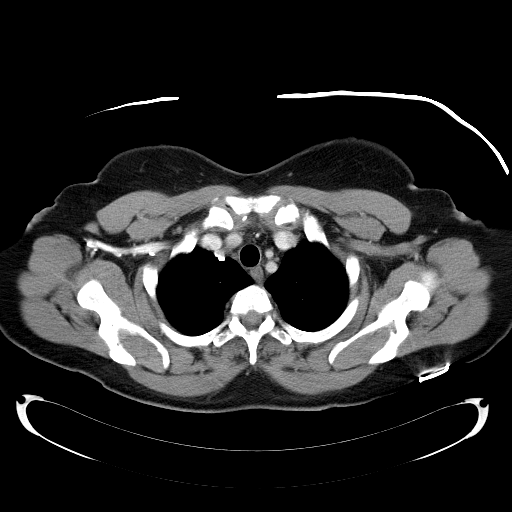
[im 117/126  lung]
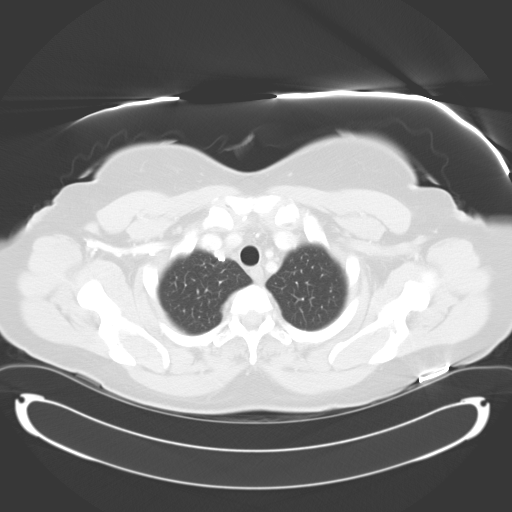

[13 of 31 positions shown; findings below may reference images not displayed]

FINDINGS: Asymmetric density superiorly in the right breast
surrounding the biopsy clip is noted, corresponding with known
breast cancer.  There are several asymmetrically enlarged lymph
nodes in the right axilla.  The largest of these measures 1.2 x
cm on image 19.  These nodes are hypermetabolic on today's PET CT.
There are no enlarged internal mammary, mediastinal or hilar lymph
nodes.

In the left lower lobe, there is a 7 mm ground-glass opacity on
image 33.  There is a 6 mm nodule in the right lower lobe adjacent
to a pulmonary vessel on image 34.  The lungs are otherwise clear.
There is no pleural or pericardial effusion.  No osseous lesions
are identified.
IMPRESSION: 1.  Known right breast cancer with known metastatic disease to
several mildly enlarged right axillary lymph nodes.
2.  No other definite evidence of metastatic disease to the chest.
3.  Indeterminate pulmonary nodule in the right lower lobe and
ground-glass opacity in the left lower lobe.  These are not
hypermetabolic on PET CT and would be atypical for metastatic
disease.  Attention on follow-up is recommended.

CT ABDOMEN
FINDINGS: There is a 6 mm low density lesion in the left hepatic
lobe on image 48.  No other liver lesions are identified.  The
spleen, gallbladder, pancreas and adrenal glands appear normal.
There are nonobstructing renal calculi bilaterally.  In the upper
pole of the right kidney is a simple cyst measuring 1.5 cm in
diameter on image 60.

No enlarged lymph nodes are identified in the upper abdomen.  There
are no suspicious osseous lesions. Degenerative disc disease is
noted at L5-S1.
IMPRESSION: 1.  Indeterminate small lesion in the left hepatic lobe.  This is
likely incidental and can be addressed on follow-up.
2.  No definite evidence of metastatic disease.
3.  Bilateral renal calculi and right renal cyst.

CT PELVIS
FINDINGS: There is a 1.8 cm fibroid posteriorly in the fundus
corresponding with increased metabolic activity on today's PET CT.
There is a primarily low density 3.1 x 3.6 cm right adnexal lesion
on image 103.  Along its anterior inferior margin is a 1.6 cm
component with a perceptible wall corresponding with a mildly
increased metabolic activity on today's PET CT.  This is probably a
collapsing follicle.  No solid adnexal mass identified.  Sigmoid
colon diverticular changes are present.  Numerous varicosities are
noted within the subcutaneous fat of the anterior pelvic wall.  No
acute large vessel occlusion is identified although a left iliac
vein is not well visualized.  This may reflect a congenital variant
or remote DVT.
IMPRESSION: 1.  No evidence of pelvic metastatic disease.
2.  Uterine fibroid and probable collapsing follicle on the right
accounting for hypermetabolic activity on today's PET CT.
3.  Varicosities in the anterior pelvic wall apparently related to
an absent or chronically occluded left iliac vein.

## 2010-12-11 HISTORY — PX: BUNIONECTOMY: SHX129

## 2011-01-04 LAB — SURGICAL PCR SCREEN

## 2011-01-04 LAB — CBC
HCT: 38.3 % (ref 36.0–46.0)
Hemoglobin: 12.7 g/dL (ref 12.0–15.0)
RBC: 4.42 MIL/uL (ref 3.87–5.11)
WBC: 5.7 10*3/uL (ref 4.0–10.5)

## 2011-01-04 LAB — DIFFERENTIAL
Basophils Absolute: 0 10*3/uL (ref 0.0–0.1)
Lymphocytes Relative: 26 % (ref 12–46)
Neutro Abs: 3.6 10*3/uL (ref 1.7–7.7)

## 2011-01-10 ENCOUNTER — Ambulatory Visit (HOSPITAL_COMMUNITY)
Admission: RE | Admit: 2011-01-10 | Discharge: 2011-01-11 | Disposition: A | Discharge status: Home / Self Care | Payer: 59 | Attending: Obstetrics and Gynecology | Admitting: Obstetrics and Gynecology

## 2011-01-10 DIAGNOSIS — C50919 Malignant neoplasm of unspecified site of unspecified female breast: Secondary | ICD-10-CM | POA: Insufficient documentation

## 2011-01-10 DIAGNOSIS — Z4009 Encounter for prophylactic removal of other organ: Secondary | ICD-10-CM | POA: Insufficient documentation

## 2011-01-10 LAB — PREGNANCY, URINE: Preg Test, Ur: NEGATIVE

## 2011-01-11 LAB — CBC
Platelets: 206 10*3/uL (ref 150–400)
RDW: 13.5 % (ref 11.5–15.5)
WBC: 11.2 10*3/uL — ABNORMAL HIGH (ref 4.0–10.5)

## 2011-01-12 NOTE — Op Note (Signed)
NAME:  Mary Bridges, Mary Bridges                ACCOUNT NO.:  [id number]  MEDICAL RECORD NO.:  [id number]          PATIENT TYPE:  OIB  LOCATION:  9318                          FACILITY:  [facility]  PHYSICIAN:  Mary Bridges, M.D.DATE OF BIRTH:  08/08/1956  DATE OF PROCEDURE:  01/10/2011 DATE OF DISCHARGE:                              OPERATIVE REPORT   PREOPERATIVE DIAGNOSIS:  Breast cancer.  POSTOPERATIVE DIAGNOSIS:  Breast cancer.  PROCEDURES: 1. Laparoscopic-assisted vaginal hysterectomy. 2. Bilateral salpingo-oophorectomy.  SURGEON:  Mary Leite L. Vincente Poli, MD  ASSISTANT:  Mary Cairo, MD  ANESTHESIA:  General.  FINDINGS:  Normal pelvis.  There were some varicosities in the pelvis noted.  SPECIMENS:  Uterus, cervix, tubes, and ovaries sent to Pathology.  ESTIMATED BLOOD LOSS:  175 mL.  COMPLICATIONS:  None.  DESCRIPTION OF PROCEDURE:  The patient was taken to the operating room. She was intubated.  She was prepped and draped.  Uterine manipulator was inserted.  A Foley catheter was inserted.  Attention was turned to the abdomen.  A small infraumbilical incision was made with a scalpel and the Veress needle was inserted.  Pneumoperitoneum was performed.  The Veress needle was removed and the trocar was inserted in standard fashion.  The patient was placed in Trendelenburg position.  The bowel and omentum appeared normal.  The uterus was normal size ovaries appeared unremarkable.  A small suprapubic trocar was placed suprapubically under direct visualization.  Attention was turned to the pelvis where a grasper was used to grasp the tube and ovary on the right side.  Ureter was identified well below the IP ligament.  The EnSeal instrument was placed just beneath the ovary.  It was cauterized and cut and this was carried to the round ligament with excellent hemostasis. Normal ureteral peristalsis was noted.  Likewise in standard fashion, the same thing was done on the left side  and we could see the ureter as well, peristalsing afterwards as well.  We then removed the instruments except for the trocars, released the pneumoperitoneum, placed a weighted speculum in to the vagina.  A circumferential incision was made around the cervix.  A small incision was made in the posterior cul-de-sac and one was made in the anterior cul-de-sac.  Curved Heaney clamps were used to clamp the uterosacral cardinal ligaments on either side.  Each pedicle was clamped, cut, suture ligated using 0 Vicryl suture.  We worked our way up the broad ligament.  Each pedicle was clamped, cut, and suture ligated using 0 Vicryl suture with careful attention to stay snug beside the uterus and the cervix.  Once we reached the level of the fundus, the uterus was retroflexed.  The remainder of the broad ligament was clamped on either side.  The specimen was removed and identified the uterus, cervix, tubes, and ovaries.  The pedicles were suture ligated using 0 Vicryl suture in a free tie of 0 Vicryl suture.  The posterior cuff was closed in a running locked stitch using 0 Vicryl suture.  The cuff was closed completely using 0 Vicryl suture.  Attention was then turned to the abdomen where after I  changed my gloves.  We then replaced the pneumoperitoneum, irrigated the pelvis.  Hemostasis was excellent. The pelvis was inspected even under low abdominal pressure.  As the pressure was being released and no bleeding was noted with excellent hemostasis, we then released all the pneumoperitoneum.  We closed the infraumbilical site using 0 Vicryl suture and then we closed the skin with Dermabond.  All sponge, lap, and instrument counts were correct x2. The patient tolerated the procedure very well.     Mary Bridges, M.D.     Mary Bridges  D:  01/10/2011  T:  01/11/2011  Job:  [id number]  Electronically Signed by Mary Bridges M.D. on 01/12/2011 01:50:49 PM

## 2011-01-18 ENCOUNTER — Encounter (HOSPITAL_BASED_OUTPATIENT_CLINIC_OR_DEPARTMENT_OTHER): Payer: 59 | Admitting: Oncology

## 2011-01-18 ENCOUNTER — Other Ambulatory Visit: Payer: Self-pay | Admitting: Physician Assistant

## 2011-01-18 DIAGNOSIS — C50919 Malignant neoplasm of unspecified site of unspecified female breast: Secondary | ICD-10-CM

## 2011-01-18 LAB — COMPREHENSIVE METABOLIC PANEL
ALT: 24 U/L (ref 0–35)
AST: 19 U/L (ref 0–37)
Albumin: 4.1 g/dL (ref 3.5–5.2)
CO2: 24 mEq/L (ref 19–32)
Calcium: 8.9 mg/dL (ref 8.4–10.5)
Chloride: 103 mEq/L (ref 96–112)
Potassium: 4.2 mEq/L (ref 3.5–5.3)
Sodium: 137 mEq/L (ref 135–145)
Total Protein: 6.5 g/dL (ref 6.0–8.3)

## 2011-01-18 LAB — CBC WITH DIFFERENTIAL/PLATELET
BASO%: 1.2 % (ref 0.0–2.0)
HCT: 37.7 % (ref 34.8–46.6)
MCHC: 34.1 g/dL (ref 31.5–36.0)
MONO#: 0.4 10*3/uL (ref 0.1–0.9)
NEUT%: 68.2 % (ref 38.4–76.8)
RDW: 13.9 % (ref 11.2–14.5)
WBC: 6.6 10*3/uL (ref 3.9–10.3)
lymph#: 1.4 10*3/uL (ref 0.9–3.3)

## 2011-01-18 LAB — CANCER ANTIGEN 27.29: CA 27.29: 10 U/mL (ref 0–39)

## 2011-01-18 LAB — LACTATE DEHYDROGENASE: LDH: 188 U/L (ref 94–250)

## 2011-03-16 LAB — URINALYSIS, ROUTINE W REFLEX MICROSCOPIC
Hgb urine dipstick: NEGATIVE
Ketones, ur: NEGATIVE mg/dL
Protein, ur: NEGATIVE mg/dL
Urobilinogen, UA: 0.2 mg/dL (ref 0.0–1.0)

## 2011-03-16 LAB — COMPREHENSIVE METABOLIC PANEL
ALT: 24 U/L (ref 0–35)
AST: 25 U/L (ref 0–37)
Alkaline Phosphatase: 73 U/L (ref 39–117)
CO2: 30 mEq/L (ref 19–32)
Chloride: 107 mEq/L (ref 96–112)
GFR calc non Af Amer: >60 mL/min (ref >60)
Glucose, Bld: 85 mg/dL (ref 70–99)
Potassium: 4.4 mEq/L (ref 3.5–5.1)
Sodium: 141 mEq/L (ref 135–145)

## 2011-03-16 LAB — CBC
Hemoglobin: 11.6 g/dL — ABNORMAL LOW (ref 12.0–15.0)
RBC: 3.82 MIL/uL — ABNORMAL LOW (ref 3.87–5.11)
WBC: 4.9 10*3/uL (ref 4.0–10.5)

## 2011-03-16 LAB — DIFFERENTIAL
Basophils Relative: 1 % (ref 0–1)
Eosinophils Absolute: 0.2 10*3/uL (ref 0.0–0.7)
Eosinophils Relative: 3 % (ref 0–5)
Neutrophils Relative %: 69 % (ref 43–77)

## 2011-03-20 LAB — DIFFERENTIAL
Basophils Relative: 1 % (ref 0–1)
Eosinophils Absolute: 0.1 10*3/uL (ref 0.0–0.7)
Eosinophils Relative: 2 % (ref 0–5)
Lymphs Abs: 1.7 10*3/uL (ref 0.7–4.0)
Monocytes Absolute: 0.3 10*3/uL (ref 0.1–1.0)
Monocytes Relative: 8 % (ref 3–12)
Neutrophils Relative %: 45 % (ref 43–77)

## 2011-03-20 LAB — CK TOTAL AND CKMB (NOT AT ARMC): Total CK: 71 U/L (ref 7–177)

## 2011-03-20 LAB — CBC
HCT: 36.3 % (ref 36.0–46.0)
Hemoglobin: 12.3 g/dL (ref 12.0–15.0)
MCHC: 33.8 g/dL (ref 30.0–36.0)
MCV: 89.6 fL (ref 78.0–100.0)
RBC: 4.05 MIL/uL (ref 3.87–5.11)
WBC: 3.7 10*3/uL — ABNORMAL LOW (ref 4.0–10.5)

## 2011-03-20 LAB — BASIC METABOLIC PANEL
BUN: 16 mg/dL (ref 6–23)
CO2: 26 mEq/L (ref 19–32)
Calcium: 8.6 mg/dL (ref 8.4–10.5)
Creatinine, Ser: 0.97 mg/dL (ref 0.4–1.2)
GFR calc Af Amer: >60 mL/min (ref >60)

## 2011-05-03 ENCOUNTER — Encounter (INDEPENDENT_AMBULATORY_CARE_PROVIDER_SITE_OTHER): Payer: Self-pay | Admitting: Surgery

## 2011-05-22 ENCOUNTER — Other Ambulatory Visit: Payer: Self-pay | Admitting: Physician Assistant

## 2011-05-22 ENCOUNTER — Encounter (HOSPITAL_BASED_OUTPATIENT_CLINIC_OR_DEPARTMENT_OTHER): Payer: 59 | Admitting: Oncology

## 2011-05-22 DIAGNOSIS — C773 Secondary and unspecified malignant neoplasm of axilla and upper limb lymph nodes: Secondary | ICD-10-CM

## 2011-05-22 DIAGNOSIS — C50919 Malignant neoplasm of unspecified site of unspecified female breast: Secondary | ICD-10-CM

## 2011-05-22 DIAGNOSIS — Z5111 Encounter for antineoplastic chemotherapy: Secondary | ICD-10-CM

## 2011-05-22 LAB — CBC WITH DIFFERENTIAL/PLATELET
Basophils Absolute: 0 10*3/uL (ref 0.0–0.1)
Eosinophils Absolute: 0.1 10*3/uL (ref 0.0–0.5)
HCT: 39 % (ref 34.8–46.6)
HGB: 13.2 g/dL (ref 11.6–15.9)
LYMPH%: 23.9 % (ref 14.0–49.7)
MCV: 85.9 fL (ref 79.5–101.0)
MONO#: 0.4 10*3/uL (ref 0.1–0.9)
MONO%: 7.2 % (ref 0.0–14.0)
NEUT#: 3.9 10*3/uL (ref 1.5–6.5)
Platelets: 230 10*3/uL (ref 145–400)
RBC: 4.55 10*6/uL (ref 3.70–5.45)
WBC: 5.9 10*3/uL (ref 3.9–10.3)

## 2011-05-23 LAB — COMPREHENSIVE METABOLIC PANEL
Albumin: 4.2 g/dL (ref 3.5–5.2)
Alkaline Phosphatase: 121 U/L — ABNORMAL HIGH (ref 39–117)
BUN: 11 mg/dL (ref 6–23)
CO2: 25 mEq/L (ref 19–32)
Glucose, Bld: 111 mg/dL — ABNORMAL HIGH (ref 70–99)
Sodium: 142 mEq/L (ref 135–145)
Total Bilirubin: 0.7 mg/dL (ref 0.3–1.2)
Total Protein: 6.9 g/dL (ref 6.0–8.3)

## 2011-05-23 LAB — CANCER ANTIGEN 27.29: CA 27.29: 11 U/mL (ref 0–39)

## 2011-05-23 LAB — VITAMIN D 25 HYDROXY (VIT D DEFICIENCY, FRACTURES): Vit D, 25-Hydroxy: 37 ng/mL (ref 30–89)

## 2011-05-23 LAB — LACTATE DEHYDROGENASE: LDH: 179 U/L (ref 94–250)

## 2011-09-26 ENCOUNTER — Other Ambulatory Visit: Payer: Self-pay | Admitting: Physician Assistant

## 2011-09-26 ENCOUNTER — Encounter (HOSPITAL_BASED_OUTPATIENT_CLINIC_OR_DEPARTMENT_OTHER): Payer: 59 | Admitting: Oncology

## 2011-09-26 DIAGNOSIS — Z23 Encounter for immunization: Secondary | ICD-10-CM

## 2011-09-26 DIAGNOSIS — Z86718 Personal history of other venous thrombosis and embolism: Secondary | ICD-10-CM

## 2011-09-26 DIAGNOSIS — C50919 Malignant neoplasm of unspecified site of unspecified female breast: Secondary | ICD-10-CM

## 2011-09-26 DIAGNOSIS — C773 Secondary and unspecified malignant neoplasm of axilla and upper limb lymph nodes: Secondary | ICD-10-CM

## 2011-09-26 LAB — CBC WITH DIFFERENTIAL/PLATELET
Basophils Absolute: 0 10*3/uL (ref 0.0–0.1)
Eosinophils Absolute: 0.1 10*3/uL (ref 0.0–0.5)
HGB: 13.6 g/dL (ref 11.6–15.9)
MCV: 86.7 fL (ref 79.5–101.0)
MONO#: 0.4 10*3/uL (ref 0.1–0.9)
MONO%: 6.4 % (ref 0.0–14.0)
NEUT#: 4.3 10*3/uL (ref 1.5–6.5)
RBC: 4.59 10*6/uL (ref 3.70–5.45)
RDW: 14.2 % (ref 11.2–14.5)
WBC: 6.2 10*3/uL (ref 3.9–10.3)
lymph#: 1.4 10*3/uL (ref 0.9–3.3)

## 2011-09-27 LAB — COMPREHENSIVE METABOLIC PANEL
AST: 15 U/L (ref 0–37)
Alkaline Phosphatase: 124 U/L — ABNORMAL HIGH (ref 39–117)
BUN: 20 mg/dL (ref 6–23)
Creatinine, Ser: 0.88 mg/dL (ref 0.50–1.10)
Glucose, Bld: 93 mg/dL (ref 70–99)
Total Bilirubin: 0.9 mg/dL (ref 0.3–1.2)

## 2011-09-27 LAB — CANCER ANTIGEN 27.29: CA 27.29: 12 U/mL (ref 0–39)

## 2011-09-27 LAB — VITAMIN D 25 HYDROXY (VIT D DEFICIENCY, FRACTURES): Vit D, 25-Hydroxy: 37 ng/mL (ref 30–89)

## 2011-10-24 ENCOUNTER — Encounter (INDEPENDENT_AMBULATORY_CARE_PROVIDER_SITE_OTHER): Payer: Self-pay | Admitting: General Surgery

## 2011-10-24 ENCOUNTER — Ambulatory Visit (INDEPENDENT_AMBULATORY_CARE_PROVIDER_SITE_OTHER): Payer: Self-pay | Admitting: Surgery

## 2011-11-24 ENCOUNTER — Ambulatory Visit (INDEPENDENT_AMBULATORY_CARE_PROVIDER_SITE_OTHER): Payer: Self-pay | Admitting: Surgery

## 2011-12-01 ENCOUNTER — Ambulatory Visit (INDEPENDENT_AMBULATORY_CARE_PROVIDER_SITE_OTHER): Payer: 59 | Admitting: Surgery

## 2011-12-01 ENCOUNTER — Encounter (INDEPENDENT_AMBULATORY_CARE_PROVIDER_SITE_OTHER): Payer: Self-pay | Admitting: Surgery

## 2011-12-01 VITALS — BP 134/86 | HR 60 | Temp 97.4°F | Resp 18 | Ht 68.0 in | Wt 229.0 lb

## 2011-12-01 DIAGNOSIS — C50919 Malignant neoplasm of unspecified site of unspecified female breast: Secondary | ICD-10-CM

## 2011-12-01 DIAGNOSIS — Z853 Personal history of malignant neoplasm of breast: Secondary | ICD-10-CM

## 2011-12-01 NOTE — Progress Notes (Signed)
NAME: SAKARI RAISANEN       DOB: 10-09-1956           DATE: 12/01/2011       MRN: [id number]   TASHE PURDON is a 55 y.o.Marland Kitchenfemale who presents for routine followup of her Right breast cancer diagnosed in 2010 and treated with neoadjuvant chemo, R MRM and L TM (prophylactic). She has no problems or concerns on either side.She elected no reconstruction and still does not wish to pursue this.  PFSH: She has had no significant changes since the last visit here.  ROS: There have been no significant changes since the last visit here  EXAM: General: The patient is alert, oriented, generally healty appearing, NAD. Mood and affect are normal.  Breasts:  S/P bilateral mastectomy with R radiation therapy. No evidence of recurrence - mild radiation changes, esp around axillary area  Lymphatics: She has no axillary or supraclavicular adenopathy on either side.  Extremities: Full ROM of the surgical side with no lymphedema noted.  Data Reviewed: No new data  Impression: Doing well, with no evidence of recurrent cancer or new cancer  Plan: RTC PRN

## 2011-12-01 NOTE — Patient Instructions (Signed)
We will see you again on an as needed basis.If you feel any nodules or irregular areas in the mastectomy area come back to see me Please call the office at [phone] if you have any questions or concerns. Thank you for allowing [place] to take care of you.

## 2012-01-31 ENCOUNTER — Ambulatory Visit (HOSPITAL_BASED_OUTPATIENT_CLINIC_OR_DEPARTMENT_OTHER): Payer: 59 | Admitting: Oncology

## 2012-01-31 ENCOUNTER — Telehealth: Payer: Self-pay | Admitting: Oncology

## 2012-01-31 VITALS — BP 147/90 | HR 76 | Temp 98.3°F | Ht 68.0 in | Wt 227.0 lb

## 2012-01-31 DIAGNOSIS — C50919 Malignant neoplasm of unspecified site of unspecified female breast: Secondary | ICD-10-CM

## 2012-01-31 DIAGNOSIS — Z79811 Long term (current) use of aromatase inhibitors: Secondary | ICD-10-CM

## 2012-01-31 NOTE — Telephone Encounter (Signed)
gve the pt her aug 2013 appt calendar 

## 2012-01-31 NOTE — Progress Notes (Signed)
Hematology and Oncology Follow Up Visit  Mary Bridges [id number] July 16, 1956 56 y.o. 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 Status post radiation therapy to right chest wall Xeloda for chemosensitization, status post TAH/BSO 2/ 2012 currently on Arimidex 1 mg a day.  Interim History:  There have been no intercurrent illness, hospitalizations or medication changes.  Medications: I have reviewed the patient's current medications.  Allergies:  Allergies  Allergen Reactions  . Codeine   . Erythromycin   . Penicillins     Past Medical History, Surgical history, Social history, and Family History were reviewed and updated.  Review of Systems: Constitutional:  Negative for fever, chills, night sweats, anorexia, weight loss, pain. Cardiovascular: no chest pain or dyspnea on exertion Respiratory: no cough, shortness of breath, or wheezing Neurological: negative Dermatological: negative ENT: negative Skin Gastrointestinal: negative Genito-Urinary: negative Hematological and Lymphatic: negative Breast: negative Musculoskeletal: negative Remaining ROS negative.  Physical Exam: Blood pressure 147/90, pulse 76, temperature 98.3 F (36.8 C), height 5\' 8"  (1.727 m), weight 227 lb (102.967 kg). ECOG:  General appearance: alert, cooperative and appears stated age Head: Normocephalic, without obvious abnormality, atraumatic Neck: no adenopathy, no carotid bruit, no JVD, supple, symmetrical, trachea midline and thyroid not enlarged, symmetric, no tenderness/mass/nodules Lymph nodes: Cervical, supraclavicular, and axillary nodes normal. Cardiac : regular rate and rhythm, no murmurs or gallops Pulmonary:clear to auscultation bilaterally and normal  percussion bilaterally Breasts: inspection negative, no nipple discharge or bleeding, no masses or nodularity palpable and Status post bilateral mastectomy, no evidence of chest wall recurrence Abdomen:soft, non-tender; bowel sounds normal; no masses,  no organomegaly Extremities negative Neuro: alert, oriented, normal speech, no focal findings or movement disorder noted  Lab Results: Lab Results  Component Value Date   WBC 6.2 09/26/2011   HGB 13.6 09/26/2011   HCT 39.8 09/26/2011   MCV 86.7 09/26/2011   PLT 221 09/26/2011     Chemistry      Component Value Date/Time   NA 138 09/26/2011 1233   NA 138 09/26/2011 1233   NA 138 09/26/2011 1233   K 4.0 09/26/2011 1233   K 4.0 09/26/2011 1233   K 4.0 09/26/2011 1233   CL 106 09/26/2011 1233   CL 106 09/26/2011 1233   CL 106 09/26/2011 1233   CO2 19 09/26/2011 1233   CO2 19 09/26/2011 1233   CO2 19 09/26/2011 1233   BUN 20 09/26/2011 1233   BUN 20 09/26/2011 1233   BUN 20 09/26/2011 1233   CREATININE 0.88 09/26/2011 1233   CREATININE 0.88 09/26/2011 1233   CREATININE 0.88 09/26/2011 1233      Component Value Date/Time   CALCIUM 9.7 09/26/2011 1233   CALCIUM 9.7 09/26/2011 1233   CALCIUM 9.7 09/26/2011 1233   ALKPHOS 124* 09/26/2011 1233   ALKPHOS 124* 09/26/2011 1233   ALKPHOS 124* 09/26/2011 1233   AST 15 09/26/2011 1233   AST 15 09/26/2011 1233   AST 15 09/26/2011 1233   ALT 16 09/26/2011 1233   ALT 16 09/26/2011 1233   ALT 16 09/26/2011 1233   BILITOT 0.9 09/26/2011 1233   BILITOT 0.9 09/26/2011 1233   BILITOT 0.9 09/26/2011 1233      .pathology. Radiological Studies: chest X-ray n/a Mammogram n/a Bone density 2/12-  wnl  Impression and Plan: The patient is doing well. She has no clinical evidence of recurrence. She is tolerating Arimidex and taking supplemental vitamin D. I will see her in 6 months time for followup with appropriate lab work.  More than 50% of the visit was spent in patient-related  counselling   Pierce Crane, MD 2/20/20132:37 PM

## 2012-03-11 HISTORY — PX: MENISCUS REPAIR: SHX5179

## 2012-04-08 ENCOUNTER — Other Ambulatory Visit: Payer: Self-pay | Admitting: *Deleted

## 2012-04-08 ENCOUNTER — Other Ambulatory Visit (HOSPITAL_BASED_OUTPATIENT_CLINIC_OR_DEPARTMENT_OTHER): Payer: 59 | Admitting: Lab

## 2012-04-08 DIAGNOSIS — C773 Secondary and unspecified malignant neoplasm of axilla and upper limb lymph nodes: Secondary | ICD-10-CM

## 2012-04-08 DIAGNOSIS — Z853 Personal history of malignant neoplasm of breast: Secondary | ICD-10-CM

## 2012-04-08 DIAGNOSIS — C50919 Malignant neoplasm of unspecified site of unspecified female breast: Secondary | ICD-10-CM

## 2012-04-08 LAB — CBC WITH DIFFERENTIAL/PLATELET
Basophils Absolute: 0.1 10*3/uL (ref 0.0–0.1)
Eosinophils Absolute: 0.1 10*3/uL (ref 0.0–0.5)
HGB: 12.6 g/dL (ref 11.6–15.9)
MONO#: 0.4 10*3/uL (ref 0.1–0.9)
MONO%: 7.5 % (ref 0.0–14.0)
NEUT#: 3.2 10*3/uL (ref 1.5–6.5)
RBC: 4.35 10*6/uL (ref 3.70–5.45)
RDW: 14 % (ref 11.2–14.5)
WBC: 5.7 10*3/uL (ref 3.9–10.3)
lymph#: 1.9 10*3/uL (ref 0.9–3.3)
nRBC: 0 % (ref 0–0)

## 2012-04-08 LAB — COMPREHENSIVE METABOLIC PANEL
ALT: 17 U/L (ref 0–35)
Albumin: 3.8 g/dL (ref 3.5–5.2)
CO2: 27 mEq/L (ref 19–32)
Chloride: 106 mEq/L (ref 96–112)
Potassium: 3.9 mEq/L (ref 3.5–5.3)
Sodium: 140 mEq/L (ref 135–145)
Total Bilirubin: 0.5 mg/dL (ref 0.3–1.2)
Total Protein: 7.4 g/dL (ref 6.0–8.3)

## 2012-04-08 LAB — LACTATE DEHYDROGENASE: LDH: 174 U/L (ref 94–250)

## 2012-08-06 ENCOUNTER — Telehealth: Payer: Self-pay | Admitting: *Deleted

## 2012-08-06 ENCOUNTER — Other Ambulatory Visit (HOSPITAL_BASED_OUTPATIENT_CLINIC_OR_DEPARTMENT_OTHER): Payer: 59 | Admitting: Lab

## 2012-08-06 ENCOUNTER — Ambulatory Visit (HOSPITAL_BASED_OUTPATIENT_CLINIC_OR_DEPARTMENT_OTHER): Payer: 59 | Admitting: Oncology

## 2012-08-06 VITALS — BP 127/80 | HR 80 | Temp 98.1°F | Resp 20 | Ht 68.0 in | Wt 222.0 lb

## 2012-08-06 DIAGNOSIS — C50919 Malignant neoplasm of unspecified site of unspecified female breast: Secondary | ICD-10-CM

## 2012-08-06 DIAGNOSIS — Z853 Personal history of malignant neoplasm of breast: Secondary | ICD-10-CM

## 2012-08-06 DIAGNOSIS — C50419 Malignant neoplasm of upper-outer quadrant of unspecified female breast: Secondary | ICD-10-CM

## 2012-08-06 LAB — COMPREHENSIVE METABOLIC PANEL (CC13)
Albumin: 3.8 g/dL (ref 3.5–5.0)
Alkaline Phosphatase: 113 U/L (ref 40–150)
BUN: 15 mg/dL (ref 7.0–26.0)
Calcium: 9.4 mg/dL (ref 8.4–10.4)
Creatinine: 0.9 mg/dL (ref 0.6–1.1)
Glucose: 126 mg/dl — ABNORMAL HIGH (ref 70–99)
Potassium: 4.1 mEq/L (ref 3.5–5.1)

## 2012-08-06 LAB — CBC WITH DIFFERENTIAL/PLATELET
BASO%: 1.2 % (ref 0.0–2.0)
EOS%: 2 % (ref 0.0–7.0)
HCT: 38.6 % (ref 34.8–46.6)
HGB: 13.1 g/dL (ref 11.6–15.9)
MCH: 30.1 pg (ref 25.1–34.0)
MCHC: 34.1 g/dL (ref 31.5–36.0)
MONO#: 0.4 10*3/uL (ref 0.1–0.9)
RDW: 14.3 % (ref 11.2–14.5)
WBC: 4.9 10*3/uL (ref 3.9–10.3)
lymph#: 1.6 10*3/uL (ref 0.9–3.3)

## 2012-08-06 NOTE — Telephone Encounter (Signed)
Gave patient appointment for 01-2013 

## 2012-08-06 NOTE — Progress Notes (Signed)
Hematology and Oncology Follow Up Visit  Mary Bridges [id number] [birth year] 56 y.o. 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 Status post radiation therapy to right chest wall Xeloda for chemosensitization, status post TAH/BSO 2/ 2012 currently on Arimidex 1 mg a day.  Interim History:  There have been no intercurrent illness, hospitalizations or medication changes. She has some ongoing problems with her left knee and this needs some surgical intervention likely meniscus type issues. She otherwise is doing well. She is on Arimidex and appears to be tolerating it well. She has no other complaints.  Medications: I have reviewed the patient's current medications.  Allergies:  Allergies  Allergen Reactions  . Codeine   . Erythromycin   . Penicillins     Past Medical History, Surgical history, Social history, and Family History were reviewed and updated.  Review of Systems: Constitutional:  Negative for fever, chills, night sweats, anorexia, weight loss, pain. Cardiovascular: no chest pain or dyspnea on exertion Respiratory: no cough, shortness of breath, or wheezing Neurological: negative Dermatological: negative ENT: negative Skin Gastrointestinal: negative Genito-Urinary: negative Hematological and Lymphatic: negative Breast: negative Musculoskeletal: negative Remaining ROS negative.  Physical Exam: Blood pressure 127/80, pulse 80, temperature 98.1 F (36.7 C), resp. rate 20, height 5\' 8"  (1.727 m), weight 222 lb (100.699 kg). ECOG:  General appearance: alert, cooperative and appears stated age Head: Normocephalic, without obvious abnormality, atraumatic Neck: no adenopathy, no carotid bruit, no JVD, supple, symmetrical, trachea  midline and thyroid not enlarged, symmetric, no tenderness/mass/nodules Lymph nodes: Cervical, supraclavicular, and axillary nodes normal. Cardiac : regular rate and rhythm, no murmurs or gallops Pulmonary:clear to auscultation bilaterally and normal percussion bilaterally Breasts: inspection negative, no nipple discharge or bleeding, no masses or nodularity palpable and Status post bilateral mastectomy, no evidence of chest wall recurrence Abdomen:soft, non-tender; bowel sounds normal; no masses,  no organomegaly Extremities negative Neuro: alert, oriented, normal speech, no focal findings or movement disorder noted  Lab Results: Lab Results  Component Value Date   WBC 4.9 08/06/2012   HGB 13.1 08/06/2012   HCT 38.6 08/06/2012   MCV 88.2 08/06/2012   PLT 219 08/06/2012     Chemistry      Component Value Date/Time   NA 139 08/06/2012 1412   NA 140 04/08/2012 1606   K 4.1 08/06/2012 1412   K 3.9 04/08/2012 1606   CL 104 08/06/2012 1412   CL 106 04/08/2012 1606   CO2 27 08/06/2012 1412   CO2 27 04/08/2012 1606   BUN 15.0 08/06/2012 1412   BUN 15 04/08/2012 1606   CREATININE 0.9 08/06/2012 1412   CREATININE 0.85 04/08/2012 1606      Component Value Date/Time   CALCIUM 9.4 08/06/2012 1412   CALCIUM 9.3 04/08/2012 1606   ALKPHOS 113 08/06/2012 1412   ALKPHOS 104 04/08/2012 1606   AST 16 08/06/2012 1412   AST 14 04/08/2012 1606   ALT 17 08/06/2012 1412   ALT 17 04/08/2012 1606   BILITOT 1.00 08/06/2012 1412   BILITOT 0.5 04/08/2012 1606      .pathology. Radiological Studies: chest X-ray n/a Mammogram n/a Bone density 2/12- wnl  Impression and Plan: The patient is doing well. She has no clinical evidence of recurrence. She is tolerating Arimidex and taking  supplemental vitamin D. I will see her in 6 months time for followup with appropriate lab work.  More than 50% of the visit was spent in patient-related counselling   Pierce Crane, MD 8/27/20137:27 PM

## 2012-12-11 HISTORY — PX: GUM SURGERY: SHX658

## 2013-01-10 ENCOUNTER — Telehealth: Payer: Self-pay | Admitting: *Deleted

## 2013-01-10 ENCOUNTER — Encounter: Payer: Self-pay | Admitting: Oncology

## 2013-01-10 NOTE — Telephone Encounter (Signed)
Pt called about her appt and I confirmed 02/04/13 appt w/ pt.  Mailed letter & calendar to pt.

## 2013-02-04 ENCOUNTER — Encounter: Payer: Self-pay | Admitting: Family

## 2013-02-04 ENCOUNTER — Other Ambulatory Visit: Payer: Self-pay | Admitting: Oncology

## 2013-02-04 ENCOUNTER — Telehealth: Payer: Self-pay | Admitting: Oncology

## 2013-02-04 ENCOUNTER — Ambulatory Visit (HOSPITAL_BASED_OUTPATIENT_CLINIC_OR_DEPARTMENT_OTHER): Payer: 59 | Admitting: Family

## 2013-02-04 ENCOUNTER — Ambulatory Visit (HOSPITAL_BASED_OUTPATIENT_CLINIC_OR_DEPARTMENT_OTHER): Payer: 59 | Admitting: Lab

## 2013-02-04 VITALS — BP 138/89 | HR 82 | Temp 98.6°F | Resp 20 | Ht 68.0 in | Wt 227.9 lb

## 2013-02-04 DIAGNOSIS — C50919 Malignant neoplasm of unspecified site of unspecified female breast: Secondary | ICD-10-CM

## 2013-02-04 DIAGNOSIS — Z853 Personal history of malignant neoplasm of breast: Secondary | ICD-10-CM

## 2013-02-04 DIAGNOSIS — C50911 Malignant neoplasm of unspecified site of right female breast: Secondary | ICD-10-CM

## 2013-02-04 DIAGNOSIS — Z79811 Long term (current) use of aromatase inhibitors: Secondary | ICD-10-CM

## 2013-02-04 LAB — CBC WITH DIFFERENTIAL/PLATELET
BASO%: 1 % (ref 0.0–2.0)
Eosinophils Absolute: 0.1 10*3/uL (ref 0.0–0.5)
HCT: 41.3 % (ref 34.8–46.6)
LYMPH%: 31 % (ref 14.0–49.7)
MCHC: 33.2 g/dL (ref 31.5–36.0)
MCV: 85.7 fL (ref 79.5–101.0)
MONO#: 0.4 10*3/uL (ref 0.1–0.9)
NEUT%: 58.8 % (ref 38.4–76.8)
Platelets: 244 10*3/uL (ref 145–400)
WBC: 6 10*3/uL (ref 3.9–10.3)

## 2013-02-04 LAB — COMPREHENSIVE METABOLIC PANEL (CC13)
Albumin: 4 g/dL (ref 3.5–5.0)
BUN: 14.5 mg/dL (ref 7.0–26.0)
Calcium: 10.1 mg/dL (ref 8.4–10.4)
Chloride: 103 mEq/L (ref 98–107)
Glucose: 97 mg/dl (ref 70–99)
Potassium: 4.6 mEq/L (ref 3.5–5.1)

## 2013-02-04 MED ORDER — ANASTROZOLE 1 MG PO TABS: 1.0000 mg | ORAL_TABLET | Freq: Every day | ORAL | Status: DC

## 2013-02-04 NOTE — Progress Notes (Signed)
[facility]  [phone] [phone]  OFFICE PROGRESS NOTE   ID: Mary Bridges   DOB: Apr 24, 1956  MR#: [id number]  MWU#:[id number]   PCP: Mary Manges, FNP GYN: Mary Bridges. Mary Poli, MD SU: Mary Bent, MD   HISTORY OF PRESENT ILLNESS: From Dr. Theron Arista Bridges's new patient evaluation dated 04/26/2009:  "This is a delightful 57 year old woman from [place], [place] here today with her husband for evaluation and treatment of breast cancer.  [name] has been in relatively good health.  She is here today with her husband.  She has not had regular mammogram since about 2007.  She had a screening mammogram followed by a diagnostic right mammogram and right breast ultrasound on 04/21/2009.  Physical exam showed a palpable thickening 12 o'clock position right breast with right nipple retraction.  Ultrasound showed a mass in the right breast 4 cm from the nipple measuring 6.3 x 2.8 x 5.1 cm.  Ultrasound of the right axilla showed at least some enlarged right axillary lymph nodes with attenuated fatty hila.  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  Prognostic panel is pending.  She is due for an MRI scan tomorrow.  She is seeing Dr. Luisa Bridges who has in turn referred her for neoadjuvant therapy." Her subsequent history is as detailed below.   INTERVAL HISTORY: Dr. Darnelle Bridges and I saw Mary Bridges today for follow up of her breast cancer.  She was last seen by Dr. Donnie Bridges on 08/06/2012. The patient is establishing herself with Dr. Darnelle Bridges today.   REVIEW OF SYSTEMS: A 10 point review of systems was completed and is negative except for the patient has experienced bilateral knee pain since early 2013. She denies any other symptomatology.   PAST MEDICAL HISTORY: Past Medical History  Diagnosis Date  . Kidney infection  09/1981    during first pregnancy  . History of blood clots 05/1992    Left leg  . Breast cancer, Right 04/21/2009     PAST SURGICAL HISTORY: Past Surgical History  Procedure Laterality Date  . Mastectomy  09/22/09    Bilateral  . Wisdom tooth extraction      in her teens  . Novasure ablation  09/2005  . Eye surgery      Lasix  . Port-a-cath removal  07/07/10  . Abdominal hysterectomy    . Meniscus repair Right 03/2012    FAMILY HISTORY Family History  Problem Relation Age of Onset  . Cancer Father     Prostate, bone     GYNECOLOGIC HISTORY: G5, P4, menarche age 62, menopausal.  SOCIAL HISTORY: The patient is from [place] but previously lived in [place], [place] prior to coming to [place] where they live for over 5 years.  Her husband is a Emergency planning/management officer for [employer].  They have four children.  She has a daughter living in [place] and son living in [place], another child living in [place], [place].  The patient is a [employer].    ADVANCED DIRECTIVES: Not on file  HEALTH MAINTENANCE: History  Substance Use Topics  . Smoking status: Never Smoker   . Smokeless tobacco: Never Used  . Alcohol Use: No     Colonoscopy:  The patient reports her last colonoscopy in 2008.  PAP:  The patient reports her last Pap smear was in 2011.  Bone  density: Not on file  Lipid panel: Not on file  Allergies  Allergen Reactions  . Codeine   . Erythromycin   . Penicillins     Current Outpatient Prescriptions  Medication Sig Dispense Refill  . anastrozole (ARIMIDEX) 1 MG tablet Take 1 tablet (1 mg total) by mouth daily.  30 tablet  24  . Calcium Carbonate-Vitamin D (CALCIUM + D PO) Take 600 mg by mouth daily.        . citalopram (CELEXA) 20 MG tablet Take 20 mg by mouth daily.       . fexofenadine (ALLEGRA ALLERGY) 180 MG tablet Take 180 mg by mouth daily.        . Glucosamine-Chondroitin (OSTEO BI-FLEX REGULAR STRENGTH PO) Take by mouth  daily.        Marland Kitchen rOPINIRole (REQUIP) 0.25 MG tablet 0.5 mg daily.        No current facility-administered medications for this visit.    OBJECTIVE: Filed Vitals:   02/04/13 1114  BP: 138/89  Pulse: 82  Temp: 98.6 F (37 C)  Resp: 20     Body mass index is 34.66 kg/(m^2).       ECOG FS: And Grade 2 - Symptomatic, but fully ambulatory           General appearance: Alert, cooperative, well nourished, no apparent distress Head: Normocephalic, without obvious abnormality, atraumatic Eyes: Conjunctivae/corneas clear, PERRLA, EOMI, right eye strabismus Nose: Nares, septum and mucosa are normal, no drainage or sinus tenderness. Neck: No adenopathy, supple, symmetrical, trachea midline, thyroid not enlarged, no tenderness Resp: Clear to auscultation bilaterally Cardio: Regular rate and rhythm, S1, S2 normal, no murmur, click, rub or gallop Breasts: Breasts are surgically absent bilaterally, well-healed surgical scars, no lymphadenopathy, no axilla fullness, radiation changes, benign breast exam GI: Soft, distended, non-tender, hypoactive bowel sounds, no organomegaly Extremities: Extremities normal, atraumatic, no cyanosis or edema Lymph nodes: Cervical, supraclavicular, and axillary nodes normal Neurologic: Grossly normal   LAB RESULTS: Lab Results  Component Value Date   WBC 6.0 02/04/2013   NEUTROABS 3.5 02/04/2013   HGB 13.7 02/04/2013   HCT 41.3 02/04/2013   MCV 85.7 02/04/2013   PLT 244 02/04/2013      Chemistry      Component Value Date/Time   NA 140 02/04/2013 1234   NA 140 04/08/2012 1606   K 4.6 02/04/2013 1234   K 3.9 04/08/2012 1606   CL 103 02/04/2013 1234   CL 106 04/08/2012 1606   CO2 28 02/04/2013 1234   CO2 27 04/08/2012 1606   BUN 14.5 02/04/2013 1234   BUN 15 04/08/2012 1606   CREATININE 0.9 02/04/2013 1234   CREATININE 0.85 04/08/2012 1606      Component Value Date/Time   CALCIUM 10.1 02/04/2013 1234   CALCIUM 9.3 04/08/2012 1606   ALKPHOS 147 02/04/2013 1234    ALKPHOS 104 04/08/2012 1606   AST 15 02/04/2013 1234   AST 14 04/08/2012 1606   ALT 17 02/04/2013 1234   ALT 17 04/08/2012 1606   BILITOT 0.90 02/04/2013 1234   BILITOT 0.5 04/08/2012 1606       Lab Results  Component Value Date   LABCA2 14 02/04/2013    Urinalysis    Component Value Date/Time   COLORURINE YELLOW 09/20/2009 0955   APPEARANCEUR CLEAR 09/20/2009 0955   LABSPEC 1.019 09/20/2009 0955   PHURINE 6.0 09/20/2009 0955   GLUCOSEU NEGATIVE 09/20/2009 0955   HGBUR NEGATIVE 09/20/2009 0955   BILIRUBINUR NEGATIVE 09/20/2009 0955  KETONESUR NEGATIVE 09/20/2009 0955   PROTEINUR NEGATIVE 09/20/2009 0955   UROBILINOGEN 0.2 09/20/2009 0955   NITRITE NEGATIVE 09/20/2009 0955   LEUKOCYTESUR NEGATIVE MICROSCOPIC NOT DONE ON URINES WITH NEGATIVE PROTEIN, BLOOD, LEUKOCYTES, NITRITE, OR GLUCOSE <1000 mg/dL. 09/20/2009 0955    STUDIES: No results found.  ASSESSMENT: 57 y.o. [employer], [place] woman:  1. Routine screening mammogram on 04/15/2009 that showed a possible mass in the right breast additional evaluation indicated with no specific malignancy in the left breast.  2. Ultrasound-guided biopsy of the mass at the 12:00 position in the right breast and enlarged right axillary lymph node on 04/21/2009. Biopsy showed invasive mammary carcinoma minute static carcinoma in the lymph node.  3. MRI of the bilateral breasts with and without contrast on 04/27/2009 showed a 5.0 x 4.8 x 3.5 cm abnormal enhancement in the upper inner and upper outer quadrants of the right breast with multiple prominent right axillary lymph nodes. No evidence of malignancy in the left breast  4. The patient received neoadjuvant chemotherapy with 4 cycles of FEC from 05/12/2009 through 06/24/1999 and followed by Taxotere for 4 cycles from 07/07/2009 through 08/25/2009. The patient also received Lupron from 12/29/2009 through 05/25/2010 monthly and Lupron from 07/20/2010 through 04/16/2011 showed well every  3 months.  5. 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 6 of 15 lymph nodes were positive for metastatic carcinoma.  ER 83%, PR 95%, Ki-67 19%, HER-2/neu no amplification with a ratio of 1.17.  6. The patient underwent radiation therapy from 10/25/2009 through 12/01/2009.  7. The patient has been taking Arimidex since 12/2009.  8. The patient underwent TAH/BSO on 01/2011.   PLAN: Mary Bridges is doing well from a breast cancer point of view. She was given a prescription for Arimidex today #30 with 24 refills. The plan is for her to complete 5 years of Arimidex. The patient's next bone density exam due in April 2014 and her next mammogram is due after 08/31/2013.  We plan to see the patient again in 6 months at which time we will check a CBC, CMP, vitamin D level and CA 27.29 (at the patient's request). The patient was encouraged to participate in water aerobics to help her bilateral knee pain.   All questions were answered.  The patient was encouraged to contact us in the interim with any problems, questions or concerns.   Larina Bras, NP-C 02/05/2013, 7:36 PM

## 2013-02-04 NOTE — Patient Instructions (Addendum)
Please contact us at [phone] if you have any questions or concerns. 

## 2013-02-04 NOTE — Telephone Encounter (Signed)
, °

## 2013-02-07 ENCOUNTER — Other Ambulatory Visit: Payer: 59 | Admitting: Lab

## 2013-02-07 ENCOUNTER — Ambulatory Visit: Payer: 59 | Admitting: Oncology

## 2013-04-08 ENCOUNTER — Other Ambulatory Visit: Payer: Self-pay | Admitting: Obstetrics and Gynecology

## 2013-05-12 ENCOUNTER — Telehealth: Payer: Self-pay | Admitting: *Deleted

## 2013-05-13 ENCOUNTER — Ambulatory Visit (HOSPITAL_BASED_OUTPATIENT_CLINIC_OR_DEPARTMENT_OTHER): Payer: 59 | Admitting: Oncology

## 2013-05-13 DIAGNOSIS — Z853 Personal history of malignant neoplasm of breast: Secondary | ICD-10-CM

## 2013-05-13 DIAGNOSIS — N6459 Other signs and symptoms in breast: Secondary | ICD-10-CM

## 2013-05-13 DIAGNOSIS — C50919 Malignant neoplasm of unspecified site of unspecified female breast: Secondary | ICD-10-CM

## 2013-05-13 DIAGNOSIS — C773 Secondary and unspecified malignant neoplasm of axilla and upper limb lymph nodes: Secondary | ICD-10-CM

## 2013-05-13 NOTE — Progress Notes (Signed)
[facility]  [phone] [phone]  OFFICE PROGRESS NOTE   ID: Mary Bridges   DOB: October 20, 1956  MR#: [id number]  ION#:[id number]   PCP: Claude Manges, FNP GYN: Stann Mainland. Vincente Poli, MD SU: Cyndia Bent, MD   HISTORY OF PRESENT ILLNESS: From Dr. Theron Arista Rubin's new patient evaluation dated 04/26/2009:  "This is a delightful 57 year old woman from [place], [place] here today with her husband for evaluation and treatment of breast cancer.  Ms. Lovejoy has been in relatively good health.  She is here today with her husband.  She has not had regular mammogram since about 2007.  She had a screening mammogram followed by a diagnostic right mammogram and right breast ultrasound on 04/21/2009.  Physical exam showed a palpable thickening 12 o'clock position right breast with right nipple retraction.  Ultrasound showed a mass in the right breast 4 cm from the nipple measuring 6.3 x 2.8 x 5.1 cm.  Ultrasound of the right axilla showed at least some enlarged right axillary lymph nodes with attenuated fatty hila.  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  Prognostic panel is pending.  She is due for an MRI scan tomorrow.  She is seeing Dr. Luisa Hart who has in turn referred her for neoadjuvant therapy."   Her subsequent history is as detailed below.   INTERVAL HISTORY: Mary Bridges was worked in today on an emergency basis because she called with concerns regarding a rash on her right chest wall.  REVIEW OF SYSTEMS: She has not been having any unusual headaches, visual changes, nausea, vomiting, dizziness, or balance problems. Denies cough, phlegm production, or pleurisy. Has not been any change in bowel or bladder habits. Denies pain, itching, or burning, particularly in the area in question. She just noticed a rash, she says,  earlier today. A detailed review of systems was otherwise noncontributory   PAST MEDICAL HISTORY: Past Medical History  Diagnosis Date  . Kidney infection 09/1981    during first pregnancy  . History of blood clots 05/1992    Left leg  . Breast cancer, Right 04/21/2009     PAST SURGICAL HISTORY: Past Surgical History  Procedure Laterality Date  . Mastectomy  09/22/09    Bilateral  . Wisdom tooth extraction      in her teens  . Novasure ablation  09/2005  . Eye surgery      Lasix  . Port-a-cath removal  07/07/10  . Abdominal hysterectomy    . Meniscus repair Right 03/2012    FAMILY HISTORY Family History  Problem Relation Age of Onset  . Cancer Father     Prostate, bone     GYNECOLOGIC HISTORY: G5, P4, menarche age 44, menopausal.  SOCIAL HISTORY: The patient is from [place] but previously lived in [place] prior to coming to [place]. 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 Her husband is a Emergency planning/management officer for [employer].  They have four children.  She has a daughter living in [place] who does art therapy, a son living in [place] who works in Consulting civil engineer, a son living in [place] who has a Best boy in pharmacy, and a son living at home ("he delivers beats is".     ADVANCED DIRECTIVES: Not on file  HEALTH MAINTENANCE: History  Substance Use Topics  . Smoking status: Never Smoker   .  Smokeless tobacco: Never Used  . Alcohol Use: No     Colonoscopy:  The patient reports her last colonoscopy in 2008.  PAP:  The patient reports her last Pap smear was in 2011.  Bone density: Not on file  Lipid panel: Not on file  Allergies  Allergen Reactions  . Codeine   . Erythromycin   . Penicillins     Current Outpatient Prescriptions  Medication Sig Dispense Refill  . anastrozole (ARIMIDEX) 1 MG tablet Take 1 tablet (1 mg total) by mouth daily.  30 tablet  24  . Calcium Carbonate-Vitamin D (CALCIUM + D PO) Take 600 mg by mouth daily.        .  citalopram (CELEXA) 20 MG tablet Take 20 mg by mouth daily.       . fexofenadine (ALLEGRA ALLERGY) 180 MG tablet Take 180 mg by mouth daily.        . Glucosamine-Chondroitin (OSTEO BI-FLEX REGULAR STRENGTH PO) Take by mouth daily.        Marland Kitchen rOPINIRole (REQUIP) 0.25 MG tablet 0.5 mg daily.        No current facility-administered medications for this visit.    OBJECTIVE: There were no vitals filed for this visit.   There is no weight on file to calculate BMI.       ECOG FS:0  Sclerae unicteric Oropharynx clear No cervical or supraclavicular adenopathy Lungs no rales or rhonchi Heart regular rate and rhythm Abd benign MSK no focal spinal tenderness, no peripheral edema Neuro: nonfocal, well oriented, anxious affect Breasts: The patient is status post bilateral mastectomies. The "rash" she has developed in the lateral aspect of the chest wall are telangiectasias. There is no mass, there is no skin breakdown, and there are no findings of concern. The left chest wall is benign.   LAB RESULTS: Lab Results  Component Value Date   WBC 6.0 02/04/2013   NEUTROABS 3.5 02/04/2013   HGB 13.7 02/04/2013   HCT 41.3 02/04/2013   MCV 85.7 02/04/2013   PLT 244 02/04/2013      Chemistry      Component Value Date/Time   NA 140 02/04/2013 1234   NA 140 04/08/2012 1606   K 4.6 02/04/2013 1234   K 3.9 04/08/2012 1606   CL 103 02/04/2013 1234   CL 106 04/08/2012 1606   CO2 28 02/04/2013 1234   CO2 27 04/08/2012 1606   BUN 14.5 02/04/2013 1234   BUN 15 04/08/2012 1606   CREATININE 0.9 02/04/2013 1234   CREATININE 0.85 04/08/2012 1606      Component Value Date/Time   CALCIUM 10.1 02/04/2013 1234   CALCIUM 9.3 04/08/2012 1606   ALKPHOS 147 02/04/2013 1234   ALKPHOS 104 04/08/2012 1606   AST 15 02/04/2013 1234   AST 14 04/08/2012 1606   ALT 17 02/04/2013 1234   ALT 17 04/08/2012 1606   BILITOT 0.90 02/04/2013 1234   BILITOT 0.5 04/08/2012 1606       Lab Results  Component Value Date   LABCA2 14 02/04/2013     Urinalysis    Component Value Date/Time   COLORURINE YELLOW 09/20/2009 0955   APPEARANCEUR CLEAR 09/20/2009 0955   LABSPEC 1.019 09/20/2009 0955   PHURINE 6.0 09/20/2009 0955   GLUCOSEU NEGATIVE 09/20/2009 0955   HGBUR NEGATIVE 09/20/2009 0955   BILIRUBINUR NEGATIVE 09/20/2009 0955   KETONESUR NEGATIVE 09/20/2009 0955   PROTEINUR NEGATIVE 09/20/2009 0955   UROBILINOGEN 0.2 09/20/2009 0955   NITRITE NEGATIVE 09/20/2009 0955  LEUKOCYTESUR NEGATIVE MICROSCOPIC NOT DONE ON URINES WITH NEGATIVE PROTEIN, BLOOD, LEUKOCYTES, NITRITE, OR GLUCOSE <1000 mg/dL. 09/20/2009 0955    STUDIES: No results found.  ASSESSMENT: 57 y.o. [employer]  Woman:  (1) status post right breast and right axillary lymph node biopsy 04/21/2009 both positive for a clinical T3, N1-2, stage III invasive mammary carcinoma with lobular features, grade 2,  (2) treated neoadjuvanly with 4 cycles of FEC from 05/12/2009 through 06/24/1999 followed by Taxotere for 4 cycles from 07/07/2009 through 08/25/2009. The patient also received Lupron from 12/29/2009 through 05/25/2010 monthly and then from 07/20/2010 through 04/16/2011 given every 3 months.  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  6. The patient underwent radiation therapy from 10/25/2009 through 12/01/2009.  7. The patient has been taking Arimidex since 12/2009.  8. The patient underwent TAH/BSO on 01/2011.   PLAN: Luckily the "rash" in the right chest wall area turned out to be a false alarm. I reassured Sherisa that there is no evidence of recurrence. She is already set up for followup here in August. She knows to call for any problems that may develop before the next visit.  Lowella Dell, MD  05/13/2013, 7:07 PM

## 2013-07-02 ENCOUNTER — Telehealth: Payer: Self-pay | Admitting: Oncology

## 2013-07-02 NOTE — Telephone Encounter (Signed)
pt needed to r/s appt.Marland KitchenMarland KitchenDone

## 2013-07-28 ENCOUNTER — Telehealth: Payer: Self-pay | Admitting: Oncology

## 2013-07-28 ENCOUNTER — Other Ambulatory Visit: Payer: Self-pay | Admitting: Emergency Medicine

## 2013-07-28 ENCOUNTER — Other Ambulatory Visit (HOSPITAL_BASED_OUTPATIENT_CLINIC_OR_DEPARTMENT_OTHER): Payer: 59 | Admitting: Lab

## 2013-07-28 ENCOUNTER — Ambulatory Visit (HOSPITAL_BASED_OUTPATIENT_CLINIC_OR_DEPARTMENT_OTHER): Payer: 59 | Admitting: Family

## 2013-07-28 ENCOUNTER — Encounter: Payer: Self-pay | Admitting: Family

## 2013-07-28 VITALS — BP 135/82 | HR 81 | Temp 98.3°F | Resp 18 | Ht 68.0 in | Wt 229.9 lb

## 2013-07-28 DIAGNOSIS — Z853 Personal history of malignant neoplasm of breast: Secondary | ICD-10-CM

## 2013-07-28 DIAGNOSIS — C50219 Malignant neoplasm of upper-inner quadrant of unspecified female breast: Secondary | ICD-10-CM

## 2013-07-28 DIAGNOSIS — R05 Cough: Secondary | ICD-10-CM

## 2013-07-28 DIAGNOSIS — R059 Cough, unspecified: Secondary | ICD-10-CM

## 2013-07-28 DIAGNOSIS — C50419 Malignant neoplasm of upper-outer quadrant of unspecified female breast: Secondary | ICD-10-CM

## 2013-07-28 DIAGNOSIS — Z170 Estrogen receptor positive status [ER+]: Secondary | ICD-10-CM

## 2013-07-28 DIAGNOSIS — C773 Secondary and unspecified malignant neoplasm of axilla and upper limb lymph nodes: Secondary | ICD-10-CM

## 2013-07-28 DIAGNOSIS — M25559 Pain in unspecified hip: Secondary | ICD-10-CM

## 2013-07-28 DIAGNOSIS — C50911 Malignant neoplasm of unspecified site of right female breast: Secondary | ICD-10-CM

## 2013-07-28 LAB — CBC WITH DIFFERENTIAL/PLATELET
Basophils Absolute: 0.1 10*3/uL (ref 0.0–0.1)
Eosinophils Absolute: 0.1 10*3/uL (ref 0.0–0.5)
HCT: 40.1 % (ref 34.8–46.6)
HGB: 13.6 g/dL (ref 11.6–15.9)
LYMPH%: 30.2 % (ref 14.0–49.7)
MONO#: 0.5 10*3/uL (ref 0.1–0.9)
NEUT%: 57.6 % (ref 38.4–76.8)
Platelets: 222 10*3/uL (ref 145–400)
WBC: 5.3 10*3/uL (ref 3.9–10.3)
lymph#: 1.6 10*3/uL (ref 0.9–3.3)

## 2013-07-28 LAB — COMPREHENSIVE METABOLIC PANEL (CC13)
Albumin: 3.8 g/dL (ref 3.5–5.0)
Alkaline Phosphatase: 118 U/L (ref 40–150)
BUN: 14.6 mg/dL (ref 7.0–26.0)
CO2: 23 mEq/L (ref 22–29)
Calcium: 9.3 mg/dL (ref 8.4–10.4)
Chloride: 107 mEq/L (ref 98–109)
Glucose: 102 mg/dl (ref 70–140)
Potassium: 4.4 mEq/L (ref 3.5–5.1)
Sodium: 140 mEq/L (ref 136–145)
Total Protein: 7.5 g/dL (ref 6.4–8.3)

## 2013-07-28 NOTE — Telephone Encounter (Signed)
, °

## 2013-07-28 NOTE — Patient Instructions (Addendum)
Please contact us at [phone] if you have any questions or concerns.  Please continue to do well and enjoy life!!!  Get plenty of rest, drink plenty of water, exercise daily (walking), eat a balanced diet.  Continue to take vitamin D and Calcium daily.  Take all medications as prescribed.   Results for orders placed in visit on 07/28/13 (from the past 24 hour(s))  CBC WITH DIFFERENTIAL     Status: None   Collection Time    07/28/13 10:43 AM      Result Value Range   WBC 5.3  3.9 - 10.3 10e3/uL   NEUT# 3.1  1.5 - 6.5 10e3/uL   HGB 13.6  11.6 - 15.9 g/dL   HCT 16.1  09.6 - 04.5 %   Platelets 222  145 - 400 10e3/uL   MCV 86.3  79.5 - 101.0 fL   MCH 29.3  25.1 - 34.0 pg   MCHC 33.9  31.5 - 36.0 g/dL   RBC 4.09  8.11 - 9.14 10e6/uL   RDW 14.5  11.2 - 14.5 %   lymph# 1.6  0.9 - 3.3 10e3/uL   MONO# 0.5  0.1 - 0.9 10e3/uL   Eosinophils Absolute 0.1  0.0 - 0.5 10e3/uL   Basophils Absolute 0.1  0.0 - 0.1 10e3/uL   NEUT% 57.6  38.4 - 76.8 %   LYMPH% 30.2  14.0 - 49.7 %   MONO% 8.7  0.0 - 14.0 %   EOS% 2.4  0.0 - 7.0 %   BASO% 1.1  0.0 - 2.0 %   Narrative:    Performed At:  [facility]               [address]               [place], [place] [address]  COMPREHENSIVE METABOLIC PANEL (CC13)     Status: None   Collection Time    07/28/13 10:44 AM      Result Value Range   Sodium 140  136 - 145 mEq/L   Potassium 4.4  3.5 - 5.1 mEq/L   Chloride 107  98 - 109 mEq/L   CO2 23  22 - 29 mEq/L   Glucose 102  70 - 140 mg/dl   BUN 62.1  7.0 - 30.8 mg/dL   Creatinine 0.8  0.6 - 1.1 mg/dL   Total Bilirubin 6.57  0.20 - 1.20 mg/dL   Alkaline Phosphatase 118  40 - 150 U/L   AST 20  5 - 34 U/L   ALT 25  0 - 55 U/L   Total Protein 7.5  6.4 - 8.3 g/dL   Albumin 3.8  3.5 - 5.0 g/dL   Calcium 9.3  8.4 - 84.6 mg/dL   Narrative:    Note: New Reference ranges.Performed At:  [facility]               [address]               [place], [place] [address]

## 2013-07-28 NOTE — Progress Notes (Signed)
[facility]  Telephone:([phone] [phone]  OFFICE PROGRESS NOTE   ID: [name]   DOB: 02-Dec-1956  MR#: [id number]  JYN#:[id number]   PCP: Mary Bridges, M.D. GYNStann Bridges. Mary Poli, MD SU: Mary Bent, MD   HISTORY OF PRESENT ILLNESS: From Dr. Theron Arista Bridges's new patient evaluation dated 04/26/2009: "This is a delightful 57 year old woman [place] here today with her husband for evaluation and treatment of breast cancer.  Ms. Mary Bridges has been in relatively good health.  She is here today with her husband.  She has not had regular mammogram since about 2007.  She had a screening mammogram followed by a diagnostic right mammogram and right breast ultrasound on 04/21/2009.  Physical exam showed a palpable thickening 12 o'clock position right breast with right nipple retraction.  Ultrasound showed a mass in the right breast 4 cm from the nipple measuring 6.3 x 2.8 x 5.1 cm.  Ultrasound of the right axilla showed at least some enlarged right axillary lymph nodes with attenuated fatty hila.  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  Prognostic panel is pending.  She is due for an MRI scan tomorrow.  She is seeing Dr. Luisa Hart who has in turn referred her for neoadjuvant therapy." Her subsequent history is as detailed below.   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  She was last seen by our office on 05/13/2013.  Since her last office visit she has been doing relatively well.  REVIEW OF SYSTEMS: A 10 point review of systems was completed and is negative except Mary Bridges states that she has ongoing bilateral lower extremity pain in her legs that has persisted for over a year.  The pain is worse in the  morning and when standing for long periods of time.  The bilateral lower extremity pain is relieved with NSAIDs.  The patient has a history of arthritis in her knees.  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  The patient states she gets short of breath with exertion in humid weather.  She continues to have mild vaginal dryness and hot flashes.  This further states she has a productive cough with white mucous is as persisted for over 2 years.  She denies any unusual headaches, visual changes, nausea, vomiting, dizziness, or balance problems. Denies pleurisy. Has not been any change in bowel or bladder habits. Denies itching, or burning, particularly in the area in question.  Her right chest rash has resolved.  A detailed review of systems was otherwise noncontributory.   PAST MEDICAL HISTORY: Past Medical History  Diagnosis Date  . Kidney infection 09/1981    during first pregnancy  . History of blood clots 05/1992    Left leg  . Breast cancer, Right 04/21/2009  . Arthritis     Knees     PAST SURGICAL HISTORY: Past Surgical History  Procedure Laterality Date  . Mastectomy  09/22/09    Bilateral  . Wisdom tooth extraction      in her teens  . Novasure ablation  09/2005  . Eye surgery      Lasix  . Port-a-cath removal  07/07/10  . Abdominal hysterectomy    .  Meniscus repair Right 03/2012  . Gum surgery  2014    Gum Graft    FAMILY HISTORY Family History  Problem Relation Age of Onset  . Cancer Father     Prostate, bone  . Stroke Mother      GYNECOLOGIC HISTORY: G5, P4, one stillbirth, menarche age 51, age of parity 78, menopausal since receiving chemotherapy in 2010.   SOCIAL HISTORY: The patient is from [place] but previously lived in [place] prior to coming to [place]. 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 Her husband is a Emergency planning/management officer for [employer].  They have four children.  She has a daughter living in [place] who does art therapy, a son living in [place] who works in Consulting civil engineer, a son living in [place] who has a doctorate in pharmacy, and a son living at home who delivers pizza.  In her spare time she enjoys gardening and spending time with her pets.   ADVANCED DIRECTIVES: Not on file  HEALTH MAINTENANCE: History  Substance Use Topics  . Smoking status: Never Smoker   . Smokeless tobacco: Never Used  . Alcohol Use: No     Colonoscopy:  The patient reports her last colonoscopy in 2008.  PAP:  The patient reports her last Pap smear was in Spring 2014 with Dr. Vincente Bridges.  Bone density: The patient states she had a bone density scan with Dr. Vincente Bridges in the Spring of 2014 that showed osteopenia.  Lipid panel: Not on file  Allergies  Allergen Reactions  . Codeine   . Erythromycin   . Penicillins     Current Outpatient Prescriptions  Medication Sig Dispense Refill  . anastrozole (ARIMIDEX) 1 MG tablet Take 1 tablet (1 mg total) by mouth daily.  30 tablet  24  . Calcium Carbonate-Vitamin D (CALCIUM + D PO) Take 600 mg by mouth daily.        . citalopram (CELEXA) 20 MG tablet Take 20 mg by mouth daily.       . diclofenac (VOLTAREN) 75 MG EC tablet Take 75 mg by mouth 2 (two) times daily.      . fexofenadine (ALLEGRA ALLERGY) 180 MG tablet Take 180 mg by mouth daily.        . Glucosamine-Chondroitin (OSTEO BI-FLEX REGULAR STRENGTH PO) Take by mouth daily.        Marland Kitchen rOPINIRole (REQUIP) 0.25 MG tablet 0.5 mg daily.        No current facility-administered medications for this visit.    OBJECTIVE: Filed Vitals:   07/28/13 1105  BP: 135/82  Pulse: 81  Temp: 98.3 F (36.8 C)  Resp: 18     Body mass index is 34.96 kg/(m^2).      ECOG FS: 1 - Symptomatic, but fully ambulatory   General appearance: Alert, cooperative, well nourished, no apparent distress  Head: Normocephalic, without obvious abnormality,  atraumatic  Eyes: Conjunctivae/corneas clear, PERRLA, EOMI, eye strabismus Nose: Nares, septum and mucosa are normal, no drainage or sinus tenderness.  Neck: No adenopathy, supple, symmetrical, trachea midline, thyroid not enlarged, no tenderness  Resp: Clear to auscultation bilaterally  Cardio: Regular rate and rhythm, S1, S2 normal, no murmur, click, rub or gallop  Breasts: Deferred  GI: Soft, distended, non-tender, hypoactive bowel sounds, no organomegaly  Extremities: Extremities normal, atraumatic, no cyanosis or edema  Lymph nodes: Cervical, supraclavicular, and axillary nodes normal  Neurologic: Grossly normal    LAB RESULTS: Lab Results  Component Value  Date   WBC 5.3 07/28/2013   NEUTROABS 3.1 07/28/2013   HGB 13.6 07/28/2013   HCT 40.1 07/28/2013   MCV 86.3 07/28/2013   PLT 222 07/28/2013      Chemistry      Component Value Date/Time   NA 140 07/28/2013 1044   NA 140 04/08/2012 1606   K 4.4 07/28/2013 1044   K 3.9 04/08/2012 1606   CL 103 02/04/2013 1234   CL 106 04/08/2012 1606   CO2 23 07/28/2013 1044   CO2 27 04/08/2012 1606   BUN 14.6 07/28/2013 1044   BUN 15 04/08/2012 1606   CREATININE 0.8 07/28/2013 1044   CREATININE 0.85 04/08/2012 1606      Component Value Date/Time   CALCIUM 9.3 07/28/2013 1044   CALCIUM 9.3 04/08/2012 1606   ALKPHOS 118 07/28/2013 1044   ALKPHOS 104 04/08/2012 1606   AST 20 07/28/2013 1044   AST 14 04/08/2012 1606   ALT 25 07/28/2013 1044   ALT 17 04/08/2012 1606   BILITOT 0.69 07/28/2013 1044   BILITOT 0.5 04/08/2012 1606       Lab Results  Component Value Date   LABCA2 14 02/04/2013    Urinalysis    Component Value Date/Time   COLORURINE YELLOW 09/20/2009 0955   APPEARANCEUR CLEAR 09/20/2009 0955   LABSPEC 1.019 09/20/2009 0955   PHURINE 6.0 09/20/2009 0955   GLUCOSEU NEGATIVE 09/20/2009 0955   HGBUR NEGATIVE 09/20/2009 0955   BILIRUBINUR NEGATIVE 09/20/2009 0955   KETONESUR NEGATIVE 09/20/2009 0955   PROTEINUR NEGATIVE 09/20/2009 0955    UROBILINOGEN 0.2 09/20/2009 0955   NITRITE NEGATIVE 09/20/2009 0955   LEUKOCYTESUR NEGATIVE MICROSCOPIC NOT DONE ON URINES WITH NEGATIVE PROTEIN, BLOOD, LEUKOCYTES, NITRITE, OR GLUCOSE <1000 mg/dL. 09/20/2009 0955    STUDIES: No results found.  ASSESSMENT: 57 y.o. [employer] woman:  (1) Status post right breast and right axillary lymph node biopsy 04/21/2009 both positive for a clinical T3, N1-2, stage III invasive mammary carcinoma with lobular features, grade 2,  (2) bilateral breast MRI on 04/27/2009 showed the right breast was smaller than the left. There was asymmetric multicentric non mass-like plateau type enhancement in the upper right breast, involving both the upper inner and upper outer quadrants. Biopsy clip artifact was noted within this area of asymmetric enhancement. The abnormal enhancement was estimated to measure 5.0 x 4.8 x 3.5 cm (transverse x AP x craniocaudal span).  The area of enhancement did not involve the pectoralis muscle. There were no suspicious areas of enhancement in the inferior right breast to suggest malignancy. There were no suspicious areas of enhancement in the left breast to suggest malignancy. There were multiple prominent right axillary lymph nodes. One of these had previously been biopsied and showed metastatic carcinoma. The largest lymph node identified, which demonstrated complete loss of the fatty hilum measured 1.1 x 2.0 cm in axial diameter. Left axillary lymph nodes had normal MRI appearances. No internal mammary chain lymphadenopathy was identified (clinical stage IIIA, T3 N1).  (3) Treated neoadjuvanly with 4 cycles of FEC (5-FU/epirubicin/Cytoxan) from 05/12/2009 through 06/24/1999 followed by Taxotere for 4 cycles from 07/07/2009 through 08/25/2009. The patient also received Lupron from 12/29/2009 through 05/25/2010 monthly and then from 07/20/2010 through 04/16/2011 given every 3 months.  (4) Status post bilateral mastectomy on 09/22/2009 for  a ypT2 ypN2, stage IIIA invasive ductal carcinoma of the right breast, grade 2,  estrogen receptor 83%, progesterone receptor 95%, Ki-67 19%, HER-2/neu no amplification with a ratio of 1.17.  (6)  Radiation therapy from 10/25/2009 through 12/01/2009.  (7) The patient has been taking Arimidex since 12/2009.  (8) The patient underwent TAH/BSO in 01/2011.  (9) Productive cough x 2 years  (10) Chronic bilateral knee pain   PLAN:  We will schedule Mrs. Snyders for diagnostic 2 view chest x-ray for productive cough and for a bilateral breast MRI with contrast in 08/2013.  She will continue antiestrogen therapy with Anastrozole 1 mg by mouth daily, with planned therapy to continue until 12/2014.  Mrs. Cassedy was encouraged to speak with her orthopedist regarding bilateral knee pain.  During her earlier office visit this year she was encouraged to participate in water aerobics to possibly help relieve bilateral knee pain.  We plan to see her again in 01/2014 at which time we will check laboratories of CBC, CMP, LDH, vitamin D level and CA 27.29 (at her request).    Larina Bras, NP 07/28/2013, 6:36 PM

## 2013-08-04 ENCOUNTER — Ambulatory Visit: Payer: 59 | Admitting: Family

## 2013-08-04 ENCOUNTER — Telehealth: Payer: Self-pay

## 2013-08-04 ENCOUNTER — Other Ambulatory Visit: Payer: 59 | Admitting: Lab

## 2013-08-04 NOTE — Telephone Encounter (Signed)
Spoke with pt regarding lab results for 8/18. Per [facility], Vit D slightly low. Advised to take an extra 1000 IUs daily and continue taking Calcium with Vit D. Pt voiced understanding and knows to call the office with any questions. TMB

## 2013-08-19 ENCOUNTER — Other Ambulatory Visit: Payer: Self-pay | Admitting: Family

## 2013-08-19 ENCOUNTER — Ambulatory Visit (HOSPITAL_COMMUNITY)
Admission: RE | Admit: 2013-08-19 | Discharge: 2013-08-19 | Disposition: A | Discharge status: Home / Self Care | Payer: 59 | Source: Ambulatory Visit | Attending: Family | Admitting: Family

## 2013-08-19 DIAGNOSIS — R059 Cough, unspecified: Secondary | ICD-10-CM

## 2013-08-19 DIAGNOSIS — R05 Cough: Secondary | ICD-10-CM

## 2013-08-19 DIAGNOSIS — Z853 Personal history of malignant neoplasm of breast: Secondary | ICD-10-CM

## 2013-08-19 DIAGNOSIS — K7689 Other specified diseases of liver: Secondary | ICD-10-CM | POA: Insufficient documentation

## 2013-08-21 ENCOUNTER — Telehealth: Payer: Self-pay | Admitting: *Deleted

## 2013-08-21 ENCOUNTER — Telehealth: Payer: Self-pay | Admitting: Family

## 2013-08-21 NOTE — Telephone Encounter (Signed)
Spoke to Mary Bridges and reviewed both chest x-ray and bilateral breast MRI findings with her.  Chest x-ray showed no acute cardial pulmonary disease identified.  Bilateral breast MRI showed no evidence of recurrent or metastatic breast cancer.  Cyst in right lobe of the liver at its dome is unchanged since 04/2009.  Benign findings. The recommendation on the breast MRI states followup imaging is not felt necessary unless there is persistent or intervening clinical concerns.  Mary Bridges voiced understanding.

## 2013-08-21 NOTE — Telephone Encounter (Signed)
Patient calling in to inquire about MRI/Xray results from this week. Will check with ordering provider and we will get in touch with patient.

## 2013-10-02 NOTE — Telephone Encounter (Signed)
No entry 

## 2013-10-16 ENCOUNTER — Other Ambulatory Visit: Payer: Self-pay

## 2014-01-29 ENCOUNTER — Ambulatory Visit (HOSPITAL_COMMUNITY): Payer: 59 | Attending: Orthopedic Surgery

## 2014-01-29 ENCOUNTER — Other Ambulatory Visit (HOSPITAL_COMMUNITY): Payer: Self-pay | Admitting: Cardiology

## 2014-01-29 DIAGNOSIS — I825Y9 Chronic embolism and thrombosis of unspecified deep veins of unspecified proximal lower extremity: Secondary | ICD-10-CM | POA: Insufficient documentation

## 2014-01-29 DIAGNOSIS — M7989 Other specified soft tissue disorders: Secondary | ICD-10-CM

## 2014-01-29 DIAGNOSIS — M79609 Pain in unspecified limb: Secondary | ICD-10-CM

## 2014-01-29 DIAGNOSIS — Z86718 Personal history of other venous thrombosis and embolism: Secondary | ICD-10-CM | POA: Insufficient documentation

## 2014-01-29 DIAGNOSIS — E669 Obesity, unspecified: Secondary | ICD-10-CM | POA: Insufficient documentation

## 2014-02-03 ENCOUNTER — Other Ambulatory Visit: Payer: Self-pay

## 2014-02-03 DIAGNOSIS — C50919 Malignant neoplasm of unspecified site of unspecified female breast: Secondary | ICD-10-CM

## 2014-02-04 ENCOUNTER — Other Ambulatory Visit (HOSPITAL_BASED_OUTPATIENT_CLINIC_OR_DEPARTMENT_OTHER): Payer: 59

## 2014-02-04 ENCOUNTER — Telehealth: Payer: Self-pay | Admitting: *Deleted

## 2014-02-04 ENCOUNTER — Encounter: Payer: Self-pay | Admitting: Physician Assistant

## 2014-02-04 ENCOUNTER — Ambulatory Visit (HOSPITAL_BASED_OUTPATIENT_CLINIC_OR_DEPARTMENT_OTHER): Payer: 59 | Admitting: Physician Assistant

## 2014-02-04 VITALS — BP 144/90 | HR 76 | Temp 97.6°F | Resp 18 | Ht 68.0 in | Wt 239.9 lb

## 2014-02-04 DIAGNOSIS — C50219 Malignant neoplasm of upper-inner quadrant of unspecified female breast: Secondary | ICD-10-CM

## 2014-02-04 DIAGNOSIS — C773 Secondary and unspecified malignant neoplasm of axilla and upper limb lymph nodes: Secondary | ICD-10-CM

## 2014-02-04 DIAGNOSIS — M1710 Unilateral primary osteoarthritis, unspecified knee: Secondary | ICD-10-CM | POA: Insufficient documentation

## 2014-02-04 DIAGNOSIS — C50911 Malignant neoplasm of unspecified site of right female breast: Secondary | ICD-10-CM

## 2014-02-04 DIAGNOSIS — C50419 Malignant neoplasm of upper-outer quadrant of unspecified female breast: Secondary | ICD-10-CM

## 2014-02-04 DIAGNOSIS — C50919 Malignant neoplasm of unspecified site of unspecified female breast: Secondary | ICD-10-CM

## 2014-02-04 DIAGNOSIS — Z170 Estrogen receptor positive status [ER+]: Secondary | ICD-10-CM

## 2014-02-04 DIAGNOSIS — Z853 Personal history of malignant neoplasm of breast: Secondary | ICD-10-CM

## 2014-02-04 LAB — CBC WITH DIFFERENTIAL/PLATELET
BASO%: 1.1 % (ref 0.0–2.0)
BASOS ABS: 0.1 10*3/uL (ref 0.0–0.1)
EOS%: 2.3 % (ref 0.0–7.0)
Eosinophils Absolute: 0.1 10*3/uL (ref 0.0–0.5)
HEMATOCRIT: 39.7 % (ref 34.8–46.6)
HEMOGLOBIN: 13.4 g/dL (ref 11.6–15.9)
LYMPH%: 31.4 % (ref 14.0–49.7)
MCH: 29.5 pg (ref 25.1–34.0)
MCHC: 33.8 g/dL (ref 31.5–36.0)
MCV: 87.5 fL (ref 79.5–101.0)
MONO#: 0.4 10*3/uL (ref 0.1–0.9)
MONO%: 6.6 % (ref 0.0–14.0)
NEUT#: 3.4 10*3/uL (ref 1.5–6.5)
NEUT%: 58.6 % (ref 38.4–76.8)
Platelets: 240 10*3/uL (ref 145–400)
RBC: 4.54 10*6/uL (ref 3.70–5.45)
RDW: 13.6 % (ref 11.2–14.5)
WBC: 5.8 10*3/uL (ref 3.9–10.3)
lymph#: 1.8 10*3/uL (ref 0.9–3.3)

## 2014-02-04 LAB — COMPREHENSIVE METABOLIC PANEL (CC13)
ALT: 28 U/L (ref 0–55)
AST: 20 U/L (ref 5–34)
Albumin: 3.7 g/dL (ref 3.5–5.0)
Alkaline Phosphatase: 143 U/L (ref 40–150)
Anion Gap: 10 mEq/L (ref 3–11)
BILIRUBIN TOTAL: 0.69 mg/dL (ref 0.20–1.20)
BUN: 19.4 mg/dL (ref 7.0–26.0)
CALCIUM: 9 mg/dL (ref 8.4–10.4)
CHLORIDE: 108 meq/L (ref 98–109)
CO2: 23 meq/L (ref 22–29)
CREATININE: 0.8 mg/dL (ref 0.6–1.1)
GLUCOSE: 107 mg/dL (ref 70–140)
Potassium: 4.4 mEq/L (ref 3.5–5.1)
Sodium: 141 mEq/L (ref 136–145)
Total Protein: 7.1 g/dL (ref 6.4–8.3)

## 2014-02-04 LAB — LACTATE DEHYDROGENASE (CC13): LDH: 176 U/L (ref 125–245)

## 2014-02-04 MED ORDER — ANASTROZOLE 1 MG PO TABS: 1.0000 mg | ORAL_TABLET | Freq: Every day | ORAL | Status: DC

## 2014-02-04 NOTE — Telephone Encounter (Signed)
appts made and printed...td 

## 2014-02-05 ENCOUNTER — Telehealth: Payer: Self-pay | Admitting: Oncology

## 2014-02-05 LAB — VITAMIN D 25 HYDROXY (VIT D DEFICIENCY, FRACTURES): VIT D 25 HYDROXY: 45 ng/mL (ref 30–89)

## 2014-02-05 NOTE — Progress Notes (Signed)
[facility]  Telephone:(336) [phone] Fax:(336) [phone]  OFFICE PROGRESS NOTE   ID: SHAKHIA GRAMAJO   DOB: 12-31-1955  MR#: [id number]  GYF#:[id number]   PCP: Luanne Bras, FNP GYN: Erik Obey. Helane Rima, MD SU: Neldon Mc, MD OTHER:  Elsie Saas, MD   HISTORY OF PRESENT ILLNESS: From Dr. Collier Salina Rubin's new patient evaluation dated 04/26/2009: "This is a delightful 58 year old woman from [place], [place] here today with her husband for evaluation and treatment of breast cancer.  Ms. Buckholtz has been in relatively good health.  She is here today with her husband.  She has not had regular mammogram since about 2007.  She had a screening mammogram followed by a diagnostic right mammogram and right breast ultrasound on 04/21/2009.  Physical exam showed a palpable thickening 12 o'clock position right breast with right nipple retraction.  Ultrasound showed a mass in the right breast 4 cm from the nipple measuring 6.3 x 2.8 x 5.1 cm.  Ultrasound of the right axilla showed at least some enlarged right axillary lymph nodes with attenuated fatty hila.  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  Prognostic panel is pending.  She is due for an MRI scan tomorrow.  She is seeing Dr. Brantley Stage who has in turn referred her for neoadjuvant therapy."  Her subsequent history is as detailed below.   INTERVAL HISTORY: Taniesha returns alone today for a routine six-month followup of her right breast carcinoma. She continues on anastrozole, and has one year remaining of her five-year therapy.  Overall, Marica is doing extremely well. She's tolerating the anastrozole well denies any problems with hot flashes, increased joint pain, or vaginal dryness. She does have arthritis in both knees, and is followed closely by Dr. Noemi Chapel. She in the process of planning  knee replacements.   Lakindra does mention that she never had genetic counseling. She is worried about the possible implications for her sister and her daughter, and is interested in genetics referral. She tells me she has "lots of cousins", but has very little information about their health history.    REVIEW OF SYSTEMS: Stormy denies any recent illnesses and has had no fevers or chills. She denies any skin changes and has had no signs of abnormal bleeding. Her energy level is fairly good. Her appetite is good, and she has no problems with nausea or recent change in bowel or bladder habits. She has some mild stress incontinence which is stable. She denies any cough, shortness of breath, orthopnea, peripheral swelling, chest pain, palpitations.  She's had no abnormal headaches or dizziness.she does have some chronic depression which she tells me is well-controlled, and she denies suicidal ideation. She currently denies any unusual myalgias, arthralgias, or bony pain, other than the knee pain as noted above.  A detailed review of systems is otherwise stable and noncontributory.   PAST MEDICAL HISTORY: Past Medical History  Diagnosis Date  . Kidney infection 09/1981    during first pregnancy  . History of blood clots 05/1992    Left leg  . Breast cancer, Right 04/21/2009  . Arthritis     Knees     PAST SURGICAL HISTORY: Past Surgical History  Procedure Laterality Date  . Mastectomy  09/22/09    Bilateral  . Wisdom tooth extraction      in her teens  . Novasure ablation  09/2005  .  Eye surgery      Lasix  . Port-a-cath removal  07/07/10  . Abdominal hysterectomy    . Meniscus repair Right 03/2012  . Gum surgery  2014    Gum Graft    FAMILY HISTORY Family History  Problem Relation Age of Onset  . Cancer Father     Prostate, bone  . Stroke Mother      GYNECOLOGIC HISTORY:  (Updated February 2015) G5, P4, one stillbirth, menarche age 69, age of parity 62, menopausal since  receiving chemotherapy in 2010.  Status post TAH/BSO in 01/2011.   SOCIAL HISTORY:  (Updated 02/04/14) The patient is from [place] but previously lived in [place] prior to coming to [place]. 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 Her husband is a Government social research officer for [employer].  They have four children.  She has a daughter living in [place], [place] who does art therapy, a son living in [place] who works in Engineer, technical sales, a son living in [place] who has a doctorate in pharmacy, and a son living at home who delivers [place].  In her spare time she enjoys gardening and spending time with her pets.   ADVANCED DIRECTIVES: Not on file  HEALTH MAINTENANCE:  (Updated February 2015) History  Substance Use Topics  . Smoking status: Never Smoker   . Smokeless tobacco: Never Used  . Alcohol Use: No    Colonoscopy:  The patient reports her last colonoscopy in 2008.  PAP: April, 2014/Dr. Grewal.  Bone density: The patient states she had a bone density scan with Dr. Helane Rima in the Spring of 2014 that showed osteopenia.  Lipid panel: Not on file   Allergies  Allergen Reactions  . Codeine   . Erythromycin   . Penicillins     Current Outpatient Prescriptions  Medication Sig Dispense Refill  . anastrozole (ARIMIDEX) 1 MG tablet Take 1 tablet (1 mg total) by mouth daily.  30 tablet  12  . b complex vitamins tablet Take 1 tablet by mouth daily.      . Calcium Carbonate-Vitamin D (CALCIUM + D PO) Take 600 mg by mouth daily.        . cholecalciferol (VITAMIN D) 1000 UNITS tablet Take 1,000 Units by mouth daily.      . citalopram (CELEXA) 20 MG tablet Take 10 mg by mouth daily.       . diclofenac (VOLTAREN) 75 MG EC tablet Take 75 mg by mouth 2 (two) times daily.      . fexofenadine (ALLEGRA ALLERGY) 180 MG tablet Take 180 mg by mouth daily.        . Glucosamine-Chondroitin (OSTEO BI-FLEX REGULAR STRENGTH PO) Take by mouth daily.        . Omega-3 Fatty Acids (OMEGA-3 FISH  OIL) 1200 MG CAPS Take 1,200 mg by mouth every morning.      Marland Kitchen rOPINIRole (REQUIP) 0.25 MG tablet 1 mg daily.        No current facility-administered medications for this visit.    OBJECTIVE:  Well-appearing Caucasian female in no acute distress Filed Vitals:   02/04/14 1057  BP: 144/90  Pulse: 76  Temp: 97.6 F (36.4 C)  Resp: 18     Body mass index is 36.49 kg/(m^2).    ECOG:  0 Filed Weights   02/04/14 1057  Weight: 239 lb 14.4 oz (108.818 kg)   Physical Exam: HEENT:  Sclerae anicteric.  Oropharynx clear, pink, and moist. Neck is supple, trachea midline.  NODES:  No cervical or supraclavicular lymphadenopathy palpated.  BREAST EXAM: Patient status post bilateral mastectomies. No suspicious nodularity or skin changes noted. There are some telangiectasias in the right chest wall. No evidence of local recurrence. Axillae are benign bilaterally for palpable lymphadenopathy. LUNGS:  Clear to auscultation bilaterally.  No wheezes or rhonchi HEART:  Regular rate and rhythm. No murmur  ABDOMEN:  Soft, nontender.  Positive bowel sounds.  MSK:  No focal spinal tenderness to palpation. Good range of motion bilaterally in the upper extremities. EXTREMITIES:  No peripheral edema.   SKIN:  Benign with no visible rashes or skin lesions. No excessive ecchymoses. No petechiae. No pallor. NEURO:  Nonfocal. Well oriented.  Appropriate affect.     LAB RESULTS: Lab Results  Component Value Date   WBC 5.8 02/04/2014   NEUTROABS 3.4 02/04/2014   HGB 13.4 02/04/2014   HCT 39.7 02/04/2014   MCV 87.5 02/04/2014   PLT 240 02/04/2014      Chemistry      Component Value Date/Time   NA 141 02/04/2014 1034   NA 140 04/08/2012 1606   K 4.4 02/04/2014 1034   K 3.9 04/08/2012 1606   CL 103 02/04/2013 1234   CL 106 04/08/2012 1606   CO2 23 02/04/2014 1034   CO2 27 04/08/2012 1606   BUN 19.4 02/04/2014 1034   BUN 15 04/08/2012 1606   CREATININE 0.8 02/04/2014 1034   CREATININE 0.85 04/08/2012 1606       Component Value Date/Time   CALCIUM 9.0 02/04/2014 1034   CALCIUM 9.3 04/08/2012 1606   ALKPHOS 143 02/04/2014 1034   ALKPHOS 104 04/08/2012 1606   AST 20 02/04/2014 1034   AST 14 04/08/2012 1606   ALT 28 02/04/2014 1034   ALT 17 04/08/2012 1606   BILITOT 0.69 02/04/2014 1034   BILITOT 0.5 04/08/2012 1606      STUDIES:  Chest x-ray on 08/19/2013 was unremarkable.  A breast MRI on 08/19/2013 was unremarkable with the exception of a subcentimeter cyst in the liver which was stable and has been since May 2010.   ASSESSMENT: 59 y.o. [employer] woman:  (1) Status post right breast and right axillary lymph node biopsy 04/21/2009 both positive for a clinical T3, N1-2, stage III invasive mammary carcinoma with lobular features, grade 2  (2) bilateral breast MRI on 04/27/2009 showed the right breast was smaller than the left. There was asymmetric multicentric non mass-like plateau type enhancement in the upper right breast, involving both the upper inner and upper outer quadrants. Biopsy clip artifact was noted within this area of asymmetric enhancement. The abnormal enhancement was estimated to measure 5.0 x 4.8 x 3.5 cm (transverse x AP x craniocaudal span).  The area of enhancement did not involve the pectoralis muscle. There were no suspicious areas of enhancement in the inferior right breast to suggest malignancy. There were no suspicious areas of enhancement in the left breast to suggest malignancy. There were multiple prominent right axillary lymph nodes. One of these had previously been biopsied and showed metastatic carcinoma. The largest lymph node identified, which demonstrated complete loss of the fatty hilum measured 1.1 x 2.0 cm in axial diameter. Left axillary lymph nodes had normal MRI appearances. No internal mammary chain lymphadenopathy was identified (clinical stage IIIA, T3 N1).  (3) Treated neoadjuvanly with 4 cycles of FEC (5-FU/epirubicin/Cytoxan) from 05/12/2009 through  06/24/1999 followed by Taxotere for 4 cycles from 07/07/2009 through 08/25/2009. The patient also received Lupron from 12/29/2009 through 05/25/2010  monthly and then from 07/20/2010 through 04/16/2011 given every 3 months.  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  (6) Radiation therapy from 10/25/2009 through 12/01/2009.  (7) The patient has been taking anastrozole since 12/2009, the plan being to continue for total of 5 years (and January 2000 610)..  (8) The patient underwent TAH/BSO in 01/2011.  (9) Chronic bilateral knee pain, followed by Dr. Noemi Chapel with replacements anticipated   PLAN:  Overall, Crissy is doing very well. She'll continue on her anastrozole for another year, and this has been refilled accordingly. Will plan seeing her back again in late January 2016 for labs and physical exam. At that time, the plan is to discontinue the anastrozole, and we will likely allow her to "graduate" from followup.  Per her request, I am also placing a referral for counseling.  Jazmine voices understanding and agreement with the above. She will call with any changes or problems prior to her next appointment here.   BERRY,AMY, PA-C 02/04/2014

## 2014-02-12 ENCOUNTER — Encounter: Payer: 59 | Admitting: Vascular Surgery

## 2014-03-04 ENCOUNTER — Encounter: Payer: Self-pay | Admitting: Vascular Surgery

## 2014-03-05 ENCOUNTER — Encounter: Payer: Self-pay | Admitting: Vascular Surgery

## 2014-03-05 ENCOUNTER — Ambulatory Visit (INDEPENDENT_AMBULATORY_CARE_PROVIDER_SITE_OTHER): Payer: 59 | Admitting: Vascular Surgery

## 2014-03-05 VITALS — BP 130/80 | HR 82 | Resp 18 | Ht 68.0 in | Wt 239.7 lb

## 2014-03-05 DIAGNOSIS — I82509 Chronic embolism and thrombosis of unspecified deep veins of unspecified lower extremity: Secondary | ICD-10-CM | POA: Insufficient documentation

## 2014-03-05 DIAGNOSIS — M79609 Pain in unspecified limb: Secondary | ICD-10-CM

## 2014-03-05 NOTE — Progress Notes (Signed)
[facility] [place] HISTORY AND PHYSICAL   History of Present Illness:  Patient is a 58 y.o. 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 She is currently undergoing evaluation by Dr. Noemi Chapel for knee replacement. She is scheduled for a right total knee replacement female 39. She states she had a DVT in the left leg in 1993. This was a postpartum a vent. She was on Coumadin for 6 months. She is currently not on a coagulation. She does not have significant swelling in her left lower extremity at this point. She denies any prior history of pulmonary embolus. She denies family history of DVT. However, she states that her daughter did develop a DVT while on oral contraceptives. She denies tobacco use. She has had several operations since her DVT with no adverse outcome. She had a hysterectomy, mastectomy, bunionectomy, and a right meniscus repair 2 years ago..  Other medical problems include history of breast cancer which is currently in remission.  Past Medical History  Diagnosis Date  . Kidney infection 09/1981    during first pregnancy  . History of blood clots 05/1992    Left leg  . Breast cancer, Right 04/21/2009  . Arthritis     Knees    Past Surgical History  Procedure Laterality Date  . Mastectomy  09/22/09    Bilateral  . Wisdom tooth extraction      in her teens  . Novasure ablation  09/2005  . Eye surgery      Lasix  . Port-a-cath removal  07/07/10  . Abdominal hysterectomy    . Meniscus repair Right 03/2012  . Gum surgery  2014    Gum Graft    Social History History  Substance Use Topics  . Smoking status: Never Smoker   . Smokeless tobacco: Never Used  . Alcohol Use: No    Family History Family History  Problem Relation Age of Onset  . Cancer Father     Prostate, bone  . Stroke Mother      Allergies  Allergies  Allergen Reactions  . Codeine   . Erythromycin   . Penicillins      Current Outpatient Prescriptions  Medication Sig Dispense Refill  . anastrozole (ARIMIDEX) 1 MG tablet Take 1 tablet (1 mg total) by mouth daily.  30 tablet  12  . b complex vitamins tablet Take 1 tablet by mouth daily.      . Calcium Carbonate-Vitamin D (CALCIUM + D PO) Take 600 mg by mouth daily.        . cholecalciferol (VITAMIN D) 1000 UNITS tablet Take 1,000 Units by mouth daily.      . citalopram (CELEXA) 20 MG tablet Take 10 mg by mouth daily.       . diclofenac (VOLTAREN) 75 MG EC tablet Take 75 mg by mouth 2 (two) times daily.      . fexofenadine (ALLEGRA ALLERGY) 180 MG tablet Take 180 mg by mouth daily.        . Glucosamine-Chondroitin (OSTEO BI-FLEX REGULAR STRENGTH PO) Take by mouth daily.        . Omega-3 Fatty Acids (OMEGA-3 FISH OIL) 1200 MG CAPS Take 1,200 mg by mouth every morning.      Marland Kitchen rOPINIRole (REQUIP) 0.25 MG tablet 1 mg daily.        No current facility-administered medications for this  visit.    ROS:   General:  No weight loss, Fever, chills  HEENT: No recent headaches, no nasal bleeding, no visual changes, no sore throat  Neurologic: No dizziness, blackouts, seizures. No recent symptoms of stroke or mini- stroke. No recent episodes of slurred speech, or temporary blindness.  Cardiac: No recent episodes of chest pain/pressure, no shortness of breath at rest.  No shortness of breath with exertion.  Denies history of atrial fibrillation or irregular heartbeat  Vascular: No history of rest pain in feet.  No history of claudication.  No history of non-healing ulcer, + history of DVT   Pulmonary: No home oxygen, no productive cough, no hemoptysis,  No asthma or wheezing  Musculoskeletal:  [ ]  Arthritis, [ ]  Low back pain,  [ x] Joint pain  Hematologic:No history of hypercoagulable state.  No history of easy bleeding.  No history of anemia  Gastrointestinal:  No hematochezia or melena,  No gastroesophageal reflux, no trouble swallowing  Urinary: [ ]  chronic Kidney disease, [ ]  on HD - [ ]  MWF or [ ]  TTHS, [ ]  Burning with urination, [ ]  Frequent urination, [ ]  Difficulty urinating;   Skin: No rashes  Psychological: No history of anxiety,  No history of depression   Physical Examination  Filed Vitals:   03/05/14 1312  BP: 130/80  Pulse: 82  Resp: 18  Height: 5\' 8"  (1.727 m)  Weight: 239 lb 11.2 oz (108.727 kg)    Body mass index is 36.45 kg/(m^2).  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 Patient is planning to have a procedure that puts her at risk for DVT. She may also have some family history of venous thrombosis.   PLAN:  Probably best option would be to place a retrievable inferior vena cava filter. We will place this a few days prior to her knee replacement. He would then remove this a few weeks after her knee replacement. She states that she is considering having both knees replaced but Dr. Noemi Chapel was considering staging is secondary to her DVT risk. I sent him a letter today to consider doing both knees at once since we would only have a short period to remove the filter after that. She is scheduled for placement of a temporary IVC filter on 04/17/2014. This benefits possible complications and procedure details were the patient today including but not limited to bleeding  infection IVC filter thrombosis she understands and agrees to proceed.  Ruta Hinds, MD [facility] [place] Office: [phone] Pager: [phone]   Ruta Hinds, MD [facility] [place]: [phone] Pager: [phone]

## 2014-03-12 ENCOUNTER — Telehealth: Payer: Self-pay | Admitting: *Deleted

## 2014-03-12 NOTE — Telephone Encounter (Signed)
Called pt to inform her that Mary Bridges would no longer be with [place] and I needed to reschedule her and she informed me that she is getting ready to have knee surgery and wants to cancel right now and get that taken care of and then she will look into getting rescheduled at a later time.  Cancelled appt as requested.

## 2014-04-07 ENCOUNTER — Encounter (HOSPITAL_COMMUNITY): Payer: Self-pay | Admitting: Pharmacy Technician

## 2014-04-08 ENCOUNTER — Encounter: Payer: Self-pay | Admitting: Physician Assistant

## 2014-04-08 ENCOUNTER — Other Ambulatory Visit: Payer: Self-pay | Admitting: Physician Assistant

## 2014-04-08 ENCOUNTER — Other Ambulatory Visit: Payer: Self-pay

## 2014-04-08 DIAGNOSIS — M179 Osteoarthritis of knee, unspecified: Secondary | ICD-10-CM | POA: Insufficient documentation

## 2014-04-08 DIAGNOSIS — N159 Renal tubulo-interstitial disease, unspecified: Secondary | ICD-10-CM

## 2014-04-08 DIAGNOSIS — Z86718 Personal history of other venous thrombosis and embolism: Secondary | ICD-10-CM

## 2014-04-08 DIAGNOSIS — M1710 Unilateral primary osteoarthritis, unspecified knee: Secondary | ICD-10-CM

## 2014-04-08 DIAGNOSIS — M1990 Unspecified osteoarthritis, unspecified site: Secondary | ICD-10-CM | POA: Insufficient documentation

## 2014-04-08 DIAGNOSIS — M47817 Spondylosis without myelopathy or radiculopathy, lumbosacral region: Secondary | ICD-10-CM

## 2014-04-08 NOTE — H&P (Signed)
TOTAL KNEE ADMISSION H&P  Patient is being admitted for bilaterally total knee arthroplasty.  Subjective:  Chief Complaint:bilaterally knee pain.  HPI: Mary Bridges, 58 y.o. female, has a history of pain and functional disability in the bilaterally knee due to arthritis and has failed non-surgical conservative treatments for greater than 12 weeks to includeNSAID's and/or analgesics, corticosteriod injections, viscosupplementation injections, flexibility and strengthening excercises, supervised PT with diminished ADL's post treatment, use of assistive devices, weight reduction as appropriate and activity modification.  Onset of symptoms was gradual, starting 5 years ago with gradually worsening course since that time. The patient noted prior procedures on the knee to include  arthroscopy and menisectomy on the right knee(s).  Patient currently rates pain in the bilaterally knee(s) at 10 out of 10 with activity. Patient has night pain, worsening of pain with activity and weight bearing, pain that interferes with activities of daily living, pain with passive range of motion, crepitus and joint swelling.  Patient has evidence of subchondral sclerosis, periarticular osteophytes and joint space narrowing by imaging studies.. There is no active infection.  Patient Active Problem List   Diagnosis Date Noted  . DJD (degenerative joint disease) of knee   . DJD (degenerative joint disease), lumbosacral   . Arthritis   . Pain in limb 03/05/2014  . Leg DVT (deep venous thromboembolism), chronic 03/05/2014  . Arthritis of knee 02/04/2014  . 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 Breast cancer, Right 04/21/2009  . History of blood clots 05/11/1992  . Kidney infection 09/10/1981   Past Medical History  Diagnosis Date  . Kidney infection 09/1981    during first pregnancy  . History of blood clots 05/1992    Left leg  . Breast cancer, Right 04/21/2009  . Arthritis    Knees  . DJD (degenerative joint disease) of knee   . DJD (degenerative joint disease), lumbosacral     Past Surgical History  Procedure Laterality Date  . Mastectomy  09/22/09    Bilateral  . Wisdom tooth extraction      in her teens  . Novasure ablation  09/2005  . Eye surgery      Lasix  . Port-a-cath removal  07/07/10  . Abdominal hysterectomy    . Meniscus repair Right 03/2012  . Gum surgery  2014    Gum Graft     (Not in a hospital admission) Allergies  Allergen Reactions  . Penicillins Hives  . Codeine Rash  . Erythromycin Rash    History  Substance Use Topics  . Smoking status: Not on file  . Smokeless tobacco: Never Used  . Alcohol Use: No    Family History  Problem Relation Age of Onset  . Cancer Father     Prostate, bone  . Stroke Mother      Review of Systems  Constitutional: Negative.   HENT: Negative.   Eyes: Negative.   Respiratory: Negative.   Cardiovascular: Negative.   Gastrointestinal: Negative.   Genitourinary: Negative.   Musculoskeletal: Positive for back pain and joint pain.  Skin: Negative.   Neurological: Negative.   Endo/Heme/Allergies: Negative.   Psychiatric/Behavioral: Negative.     Objective:  Physical Exam  Constitutional: She is oriented to person, place, and time. She appears well-developed and well-nourished.  HENT:  Head: Normocephalic and atraumatic.  Eyes: Conjunctivae and EOM are normal. Pupils are equal, round, and reactive to light.  Neck: Normal range of motion. Neck supple.  Cardiovascular:  Normal rate, regular rhythm and normal heart sounds.   Respiratory: Effort normal and breath sounds normal.  GI: Soft. Bowel sounds are normal.  Genitourinary:  Not pertinent to current symptomatology therefore not examined.  Musculoskeletal:  Examination of both knees reveal 1+ crepitation.  1+ synovitis.  Diffuse pain, left worse than right.  Range of motion -5 to 125 degrees.  Knees are stable with normal patella  tracking.  She has chronic swelling in her left calf and she said that she has noticed that this is somewhat larger than the right.  She has a remote history of a DVT postpartum in 1993.  She does have ipsilateral SLR pain on the left.  Neg on the right.  Neurological: She is alert and oriented to person, place, and time.  Skin: Skin is warm and dry.  Psychiatric: She has a normal mood and affect. Her behavior is normal.    Vital signs in last 24 hours: Last recorded: 04/29 1500   BP: 129/89 Pulse: 76  Temp: 98.4 F (36.9 C)    Height: _0  (1.702 m) SpO2: 97  Weight: 108.41 kg (239 lb)     Labs:   Estimated body mass index is 37.42 kg/(m^2) as calculated from the following:   Height as of this encounter: _1  (1.702 m).   Weight as of this encounter: 108.41 kg (239 lb).   Imaging Review Plain radiographs demonstrate severe degenerative joint disease of the bilaterally knee(s). The overall alignment issignificant varus. The bone quality appears to be good for age and reported activity level.  Assessment/Plan:  End stage arthritis, bilaterally knee   The patient history, physical examination, clinical judgment of the provider and imaging studies are consistent with end stage degenerative joint disease of the bilaterally knee(s) and total knee arthroplasty is deemed medically necessary. The treatment options including medical management, injection therapy arthroscopy and arthroplasty were discussed at length. The risks and benefits of total knee arthroplasty were presented and reviewed. The risks due to aseptic loosening, infection, stiffness, patella tracking problems, thromboembolic complications and other imponderables were discussed. The patient acknowledged the explanation, agreed to proceed with the plan and consent was signed. Patient is being admitted for inpatient treatment for surgery, pain control, PT, OT, prophylactic antibiotics, VTE prophylaxis, progressive ambulation  and ADL's and discharge planning. The patient is planning to be discharged home with home health services  Jahlil Ziller A. Kaleen Mask Physician Assistant [facility] [phone]  04/08/2014, 3:16 PM

## 2014-04-10 ENCOUNTER — Encounter (HOSPITAL_COMMUNITY)
Admission: RE | Admit: 2014-04-10 | Discharge: 2014-04-10 | Disposition: A | Discharge status: Home / Self Care | Payer: 59 | Source: Ambulatory Visit | Attending: Orthopedic Surgery | Admitting: Orthopedic Surgery

## 2014-04-10 ENCOUNTER — Encounter (HOSPITAL_COMMUNITY): Payer: Self-pay

## 2014-04-10 DIAGNOSIS — Z01810 Encounter for preprocedural cardiovascular examination: Secondary | ICD-10-CM | POA: Insufficient documentation

## 2014-04-10 DIAGNOSIS — Z01812 Encounter for preprocedural laboratory examination: Secondary | ICD-10-CM | POA: Insufficient documentation

## 2014-04-10 HISTORY — DX: Peripheral vascular disease, unspecified: I73.9

## 2014-04-10 HISTORY — DX: Depression, unspecified: F32.A

## 2014-04-10 HISTORY — DX: Major depressive disorder, single episode, unspecified: F32.9

## 2014-04-10 HISTORY — DX: Cystitis, unspecified without hematuria: N30.90

## 2014-04-10 LAB — CBC WITH DIFFERENTIAL/PLATELET
BASOS ABS: 0 10*3/uL (ref 0.0–0.1)
Basophils Relative: 0 % (ref 0–1)
Eosinophils Absolute: 0.1 10*3/uL (ref 0.0–0.7)
Eosinophils Relative: 1 % (ref 0–5)
HEMATOCRIT: 40.5 % (ref 36.0–46.0)
HEMOGLOBIN: 13.9 g/dL (ref 12.0–15.0)
LYMPHS PCT: 29 % (ref 12–46)
Lymphs Abs: 2 10*3/uL (ref 0.7–4.0)
MCH: 30.3 pg (ref 26.0–34.0)
MCHC: 34.3 g/dL (ref 30.0–36.0)
MCV: 88.4 fL (ref 78.0–100.0)
MONO ABS: 0.4 10*3/uL (ref 0.1–1.0)
Monocytes Relative: 6 % (ref 3–12)
Neutro Abs: 4.5 10*3/uL (ref 1.7–7.7)
Neutrophils Relative %: 64 % (ref 43–77)
Platelets: 240 10*3/uL (ref 150–400)
RBC: 4.58 MIL/uL (ref 3.87–5.11)
RDW: 13.4 % (ref 11.5–15.5)
WBC: 7 10*3/uL (ref 4.0–10.5)

## 2014-04-10 LAB — URINE MICROSCOPIC-ADD ON

## 2014-04-10 LAB — COMPREHENSIVE METABOLIC PANEL
ALK PHOS: 163 U/L — AB (ref 39–117)
ALT: 31 U/L (ref 0–35)
AST: 25 U/L (ref 0–37)
Albumin: 3.9 g/dL (ref 3.5–5.2)
BILIRUBIN TOTAL: 0.8 mg/dL (ref 0.3–1.2)
BUN: 16 mg/dL (ref 6–23)
CHLORIDE: 104 meq/L (ref 96–112)
CO2: 25 meq/L (ref 19–32)
CREATININE: 0.74 mg/dL (ref 0.50–1.10)
Calcium: 9.5 mg/dL (ref 8.4–10.5)
GFR calc Af Amer: >90 mL/min (ref >90)
Glucose, Bld: 95 mg/dL (ref 70–99)
POTASSIUM: 4.8 meq/L (ref 3.7–5.3)
Sodium: 143 mEq/L (ref 137–147)
Total Protein: 7.8 g/dL (ref 6.0–8.3)

## 2014-04-10 LAB — URINALYSIS, ROUTINE W REFLEX MICROSCOPIC
Bilirubin Urine: NEGATIVE
Glucose, UA: NEGATIVE mg/dL
Ketones, ur: NEGATIVE mg/dL
LEUKOCYTES UA: NEGATIVE
Nitrite: NEGATIVE
PH: 6 (ref 5.0–8.0)
Protein, ur: NEGATIVE mg/dL
SPECIFIC GRAVITY, URINE: 1.018 (ref 1.005–1.030)
UROBILINOGEN UA: 0.2 mg/dL (ref 0.0–1.0)

## 2014-04-10 LAB — PROTIME-INR
INR: 1.03 (ref 0.00–1.49)
Prothrombin Time: 13.3 seconds (ref 11.6–15.2)

## 2014-04-10 LAB — SURGICAL PCR SCREEN
MRSA, PCR: NEGATIVE
Staphylococcus aureus: NEGATIVE

## 2014-04-10 LAB — APTT: aPTT: 26 seconds (ref 24–37)

## 2014-04-10 LAB — ABO/RH: ABO/RH(D): A POS

## 2014-04-10 NOTE — Pre-Procedure Instructions (Addendum)
Vennesa Bastedo [place]  04/10/2014   Your procedure is scheduled on:  04/20/14  Report to [facility] short stay admitting at 730 AM.  Call this number if you have problems the morning of surgery: [phone]   Remember:   Do not eat food or drink liquids after midnight.   Take these medicines the morning of surgery with A SIP OF WATER: arimidex, celexa          Take all meds as ordered until day of surgery except as instructed below or per dr        Bridgette Habermann all herbel meds, nsaids (aleve,naproxen,advil,ibuprofen) 5 days prior to surgery (04/15/14) including vitamins, aspirin,diclofenac, gluscosamine, fish oil   Do not wear jewelry, make-up or nail polish.  Do not wear lotions, powders, or perfumes. You may wear deodorant.  Do not shave 48 hours prior to surgery. Men may shave face and neck.  Do not bring valuables to the hospital.  [facility] is not responsible                  for any belongings or valuables.               Contacts, dentures or bridgework may not be worn into surgery.  Leave suitcase in the car. After surgery it may be brought to your room.  For patients admitted to the hospital, discharge time is determined by your                treatment team.               Patients discharged the day of surgery will not be allowed to drive  home.  Name and phone number of your driver:   Special Instructions:  Special Instructions: [place] - Preparing for Surgery  Before surgery, you can play an important role.  Because skin is not sterile, your skin needs to be as free of germs as possible.  You can reduce the number of germs on you skin by washing with CHG (chlorahexidine gluconate) soap before surgery.  CHG is an antiseptic cleaner which kills germs and bonds with the skin to continue killing germs even after washing.  Please DO NOT use if you have an allergy to CHG or antibacterial soaps.  If your skin becomes reddened/irritated stop using the CHG and inform your nurse when you arrive  at Short Stay.  Do not shave (including legs and underarms) for at least 48 hours prior to the first CHG shower.  You may shave your face.  Please follow these instructions carefully:   1.  Shower with CHG Soap the night before surgery and the morning of Surgery.  2.  If you choose to wash your hair, wash your hair first as usual with your normal shampoo.  3.  After you shampoo, rinse your hair and body thoroughly to remove the Shampoo.  4.  Use CHG as you would any other liquid soap.  You can apply chg directly  to the skin and wash gently with scrungie or a clean washcloth.  5.  Apply the CHG Soap to your body ONLY FROM THE NECK DOWN.  Do not use on open wounds or open sores.  Avoid contact with your eyes ears, mouth and genitals (private parts).  Wash genitals (private parts)       with your normal soap.  6.  Wash thoroughly, paying special attention to the area where your surgery will be performed.  7.  Thoroughly  rinse your body with warm water from the neck down.  8.  DO NOT shower/wash with your normal soap after using and rinsing off the CHG Soap.  9.  Pat yourself dry with a clean towel.            10.  Wear clean pajamas.            11.  Place clean sheets on your bed the night of your first shower and do not sleep with pets.  Day of Surgery  Do not apply any lotions/deodorants the morning of surgery.  Please wear clean clothes to the hospital/surgery center.   Please read over the following fact sheets that you were given: Pain Booklet, Coughing and Deep Breathing, Blood Transfusion Information, Total Joint Packet, MRSA Information and Surgical Site Infection Prevention

## 2014-04-11 LAB — URINE CULTURE

## 2014-04-13 ENCOUNTER — Encounter: Payer: 59 | Admitting: Genetic Counselor

## 2014-04-13 ENCOUNTER — Other Ambulatory Visit: Payer: 59

## 2014-04-16 MED ORDER — VANCOMYCIN HCL 10 G IV SOLR
1500.0000 mg | INTRAVENOUS | Status: DC
  Filled 2014-04-16: qty 1500

## 2014-04-16 MED ORDER — POVIDONE-IODINE 7.5 % EX SOLN
Freq: Once | CUTANEOUS | Status: DC
  Filled 2014-04-16: qty 118

## 2014-04-16 MED ORDER — CHLORHEXIDINE GLUCONATE 4 % EX LIQD
60.0000 mL | Freq: Once | CUTANEOUS | Status: DC
  Filled 2014-04-16: qty 60

## 2014-04-16 MED ORDER — SODIUM CHLORIDE 0.9 % IV SOLN
INTRAVENOUS | Status: DC
  Administered 2014-04-17: 1000 mL via INTRAVENOUS

## 2014-04-16 MED ORDER — LACTATED RINGERS IV SOLN: INTRAVENOUS | Status: DC

## 2014-04-17 ENCOUNTER — Encounter (HOSPITAL_COMMUNITY)
Admission: RE | Disposition: A | Discharge status: Home / Self Care | Payer: Self-pay | Source: Ambulatory Visit | Attending: Vascular Surgery

## 2014-04-17 ENCOUNTER — Other Ambulatory Visit: Payer: Self-pay | Admitting: Oncology

## 2014-04-17 ENCOUNTER — Ambulatory Visit (HOSPITAL_COMMUNITY)
Admission: RE | Admit: 2014-04-17 | Discharge: 2014-04-17 | Disposition: A | Discharge status: Home / Self Care | Payer: 59 | Source: Ambulatory Visit | Attending: Vascular Surgery | Admitting: Vascular Surgery

## 2014-04-17 DIAGNOSIS — Z86718 Personal history of other venous thrombosis and embolism: Secondary | ICD-10-CM

## 2014-04-17 DIAGNOSIS — Z9010 Acquired absence of unspecified breast and nipple: Secondary | ICD-10-CM | POA: Insufficient documentation

## 2014-04-17 DIAGNOSIS — Z853 Personal history of malignant neoplasm of breast: Secondary | ICD-10-CM | POA: Insufficient documentation

## 2014-04-17 DIAGNOSIS — IMO0002 Reserved for concepts with insufficient information to code with codable children: Secondary | ICD-10-CM | POA: Insufficient documentation

## 2014-04-17 HISTORY — PX: INSERTION OF VENA CAVA FILTER: SHX5513

## 2014-04-17 LAB — POCT I-STAT, CHEM 8
BUN: 15 mg/dL (ref 6–23)
Calcium, Ion: 1.2 mmol/L (ref 1.12–1.23)
Chloride: 103 mEq/L (ref 96–112)
Creatinine, Ser: 0.9 mg/dL (ref 0.50–1.10)
Glucose, Bld: 111 mg/dL — ABNORMAL HIGH (ref 70–99)
HCT: 38 % (ref 36.0–46.0)
Hemoglobin: 12.9 g/dL (ref 12.0–15.0)
Potassium: 3.9 mEq/L (ref 3.7–5.3)
SODIUM: 142 meq/L (ref 137–147)
TCO2: 22 mmol/L (ref 0–100)

## 2014-04-17 SURGERY — INSERTION OF VENA CAVA FILTER
Anesthesia: LOCAL

## 2014-04-17 MED ORDER — GUAIFENESIN-DM 100-10 MG/5ML PO SYRP
15.0000 mL | ORAL_SOLUTION | ORAL | Status: DC | PRN
  Filled 2014-04-17: qty 15

## 2014-04-17 MED ORDER — FENTANYL CITRATE 0.05 MG/ML IJ SOLN
INTRAMUSCULAR | Status: AC
  Filled 2014-04-17: qty 2

## 2014-04-17 MED ORDER — MIDAZOLAM HCL 2 MG/2ML IJ SOLN
INTRAMUSCULAR | Status: AC
  Filled 2014-04-17: qty 2

## 2014-04-17 MED ORDER — OXYCODONE HCL 5 MG PO TABS: 5.0000 mg | ORAL_TABLET | ORAL | Status: DC | PRN

## 2014-04-17 MED ORDER — LIDOCAINE HCL (PF) 1 % IJ SOLN
INTRAMUSCULAR | Status: AC
  Filled 2014-04-17: qty 30

## 2014-04-17 MED ORDER — ACETAMINOPHEN 325 MG PO TABS
325.0000 mg | ORAL_TABLET | ORAL | Status: DC | PRN
  Filled 2014-04-17: qty 2

## 2014-04-17 MED ORDER — PHENOL 1.4 % MT LIQD: 1.0000 | OROMUCOSAL | Status: DC | PRN

## 2014-04-17 MED ORDER — SODIUM CHLORIDE 0.45 % IV SOLN: INTRAVENOUS | Status: DC

## 2014-04-17 MED ORDER — ONDANSETRON HCL 4 MG/2ML IJ SOLN: 4.0000 mg | Freq: Four times a day (QID) | INTRAMUSCULAR | Status: DC | PRN

## 2014-04-17 MED ORDER — LABETALOL HCL 5 MG/ML IV SOLN
10.0000 mg | INTRAVENOUS | Status: DC | PRN
Start: 2014-04-17 — End: 2014-04-17

## 2014-04-17 MED ORDER — ACETAMINOPHEN 325 MG RE SUPP
325.0000 mg | RECTAL | Status: DC | PRN
  Filled 2014-04-17: qty 2

## 2014-04-17 MED ORDER — HYDRALAZINE HCL 20 MG/ML IJ SOLN: 10.0000 mg | INTRAMUSCULAR | Status: DC | PRN

## 2014-04-17 MED ORDER — MORPHINE SULFATE 10 MG/ML IJ SOLN: 2.0000 mg | INTRAMUSCULAR | Status: DC | PRN

## 2014-04-17 MED ORDER — METOPROLOL TARTRATE 1 MG/ML IV SOLN: 2.0000 mg | INTRAVENOUS | Status: DC | PRN

## 2014-04-17 MED ORDER — HEPARIN (PORCINE) IN NACL 2-0.9 UNIT/ML-% IJ SOLN
INTRAMUSCULAR | Status: AC
  Filled 2014-04-17: qty 1000

## 2014-04-17 NOTE — H&P (Signed)
[facility] [place]  HISTORY AND PHYSICAL  History of Present Illness: Patient is a 58 y.o. 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 She is currently undergoing evaluation by Dr. Noemi Chapel for knee replacement. She is scheduled for a right total knee replacement female 18. She states she had a DVT in the left leg in 1993. This was a postpartum a vent. She was on Coumadin for 6 months. She is currently not on a coagulation. She does not have significant swelling in her left lower extremity at this point. She denies any prior history of pulmonary embolus. She denies family history of DVT. However, she states that her daughter did develop a DVT while on oral contraceptives. She denies tobacco use. She has had several operations since her DVT with no adverse outcome. She had a hysterectomy, mastectomy, bunionectomy, and a right meniscus repair 2 years ago.. Other medical problems include history of breast cancer which is currently in remission.  Past Medical History   Diagnosis  Date   .  Kidney infection  09/1981     during first pregnancy   .  History of blood clots  05/1992     Left leg   .  Breast cancer, Right  04/21/2009   .  Arthritis      Knees    Past Surgical History   Procedure  Laterality  Date   .  Mastectomy   09/22/09     Bilateral   .  Wisdom tooth extraction       in her teens   .  Novasure ablation   09/2005   .  Eye surgery       Lasix   .  Port-a-cath removal   07/07/10   .  Abdominal hysterectomy     .  Meniscus repair  Right  03/2012   .  Gum surgery   2014     Gum Graft   Social History  History   Substance Use Topics   .  Smoking status:  Never Smoker   .  Smokeless tobacco:  Never Used   .  Alcohol Use:  No   Family History  Family History   Problem  Relation  Age of Onset   .  Cancer   Father      Prostate, bone   .  Stroke  Mother    Allergies  Allergies   Allergen  Reactions   .  Codeine    .  Erythromycin    .  Penicillins     Current Outpatient Prescriptions   Medication  Sig  Dispense  Refill   .  anastrozole (ARIMIDEX) 1 MG tablet  Take 1 tablet (1 mg total) by mouth daily.  30 tablet  12   .  b complex vitamins tablet  Take 1 tablet by mouth daily.     .  Calcium Carbonate-Vitamin D (CALCIUM + D PO)  Take 600 mg by mouth daily.     .  cholecalciferol (VITAMIN D) 1000 UNITS tablet  Take 1,000 Units by mouth daily.     .  citalopram (CELEXA) 20 MG tablet  Take 10 mg by mouth daily.     .  diclofenac (VOLTAREN) 75 MG EC tablet  Take 75 mg by mouth 2 (two) times daily.     .  fexofenadine (ALLEGRA  ALLERGY) 180 MG tablet  Take 180 mg by mouth daily.     .  Glucosamine-Chondroitin (OSTEO BI-FLEX REGULAR STRENGTH PO)  Take by mouth daily.     .  Omega-3 Fatty Acids (OMEGA-3 FISH OIL) 1200 MG CAPS  Take 1,200 mg by mouth every morning.     Marland Kitchen  rOPINIRole (REQUIP) 0.25 MG tablet  1 mg daily.      No current facility-administered medications for this visit.   ROS:  General: No weight loss, Fever, chills  HEENT: No recent headaches, no nasal bleeding, no visual changes, no sore throat  Neurologic: No dizziness, blackouts, seizures. No recent symptoms of stroke or mini- stroke. No recent episodes of slurred speech, or temporary blindness.  Cardiac: No recent episodes of chest pain/pressure, no shortness of breath at rest. No shortness of breath with exertion. Denies history of atrial fibrillation or irregular heartbeat  Vascular: No history of rest pain in feet. No history of claudication. No history of non-healing ulcer, + history of DVT  Pulmonary: No home oxygen, no productive cough, no hemoptysis, No asthma or wheezing  Musculoskeletal: [ ]  Arthritis, [ ]  Low back pain, [ x] Joint pain  Hematologic:No history of hypercoagulable state. No history of easy bleeding. No  history of anemia  Gastrointestinal: No hematochezia or melena, No gastroesophageal reflux, no trouble swallowing  Urinary: [ ]  chronic Kidney disease, [ ]  on HD - [ ]  MWF or [ ]  TTHS, [ ]  Burning with urination, [ ]  Frequent urination, [ ]  Difficulty urinating;  Skin: No rashes  Psychological: No history of anxiety, No history of depression    Physical Examination  Filed Vitals:   04/17/14 0540  BP: 145/93  Pulse: 75  Temp: 98.3 F (36.8 C)  TempSrc: Oral  Resp: 18  Height: 5\' 8"  (1.727 m)  Weight: 235 lb (106.595 kg)  SpO2: 99%    Body mass index is 36.45 kg/(m^2).  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 Patient is planning to have a procedure that puts her at risk for DVT. She may also have some family history of venous thrombosis.   PLAN: Temporary IVC filter right femoral approach. We will place this a few days prior to her knee replacement. He would then remove this a few weeks after her knee replacement.  The benefits possible complications and procedure details were the patient today including but not limited to bleeding infection IVC filter thrombosis and possibility that filter may not be removable she understands and agrees to proceed.   Ruta Hinds, MD  [facility] [place]  Office: [phone]  Pager: [phone]

## 2014-04-17 NOTE — Op Note (Signed)
Procedure: #1 ultrasound of right groin #2 inferior venacavogram #3 insertion of Cook Celect inferior vena cava filter (removable)  Preoperative diagnosis: Prior DVT with pending major orthopedic surgery  Postoperative diagnosis: Same  Anesthesia: None  Indications: Patient has a known history of prior left leg extensive DVT. She is scheduled for bilateral knee replacement next week. Placement of temporary IVC filter was requested by Dr. Noemi Chapel.  Operative details: After obtaining informed consent, the patient was taken to the [facility] lab. The patient was placed in position on the Angio table. The right groin was prepped and draped in usual sterile fashion. Ultrasound was used to identify the right common femoral vein. This had normal compressibility and respiratory variation. There was no evidence of thrombus. An 18-gauge introducer needle was then used to cannulate the right femoral vein without difficulty. An 035 J-tipped guidewire was threaded up into the right common iliac vein and into the IVC.   Next the sheath delivery system for the Celect filter was placed over the guidewire at the level of approximately L2-L3 vertebral space. An inferior venacavogram was performed. The caval diameter was of suitable size for placement of the filter. The left and right renal veins were identified. There was no evidence of duplicated IVC. Next the IVC filter was loaded into the sheath delivery system. Using anatomic bony landmarks as well as the markers on the sheath delivery system the IVC filter was placed at approximately the L2-L3 vertebral space just below the renal veins. The sheath was then removed and hemostasis obtained with direct pressure. The patient tolerated the procedure well. There were no complications. The patient was taken to the holding area in stable condition.  Mary Hinds, MD [facility] [place] Office: [phone] Pager: [phone]

## 2014-04-17 NOTE — Discharge Instructions (Signed)
Angiography, Care After  Refer to this sheet in the next few weeks. These instructions provide you with information on caring for yourself after your procedure. Your health care provider may also give you more specific instructions. Your treatment has been planned according to current medical practices, but problems sometimes occur. Call your health care provider if you have any problems or questions after your procedure.  WHAT TO EXPECT AFTER THE PROCEDURE After your procedure, it is typical to have the following sensations:  Minor discomfort or tenderness and a small bump at the catheter insertion site. The bump should usually decrease in size and tenderness within 1 to 2 weeks.  Any bruising will usually fade within 2 to 4 weeks. HOME CARE INSTRUCTIONS   You may need to keep taking blood thinners if they were prescribed for you. Only take over-the-counter or prescription medicines for pain, fever, or discomfort as directed by your health care provider.  Do not apply powder or lotion to the site.  Do not sit in a bathtub, swimming pool, or whirlpool for 5 to 7 days.  You may shower 24 hours after the procedure. Remove the bandage (dressing) and gently wash the site with plain soap and water. Gently pat the site dry.  Inspect the site at least twice daily.  Limit your activity for the first 24 hours. Do not bend, squat, or lift anything over 10 lb (9 kg) or as directed by your health care provider.  Do not drive home if you are discharged the day of the procedure. Have someone else drive you. Follow instructions about when you can drive or return to work. SEEK MEDICAL CARE IF:  You get lightheaded when standing up.  You have drainage (other than a small amount of blood on the dressing).  You have chills.  You have a fever.  You have redness, warmth, swelling, or pain at the insertion site. SEEK IMMEDIATE MEDICAL CARE IF:   You develop chest pain or shortness of breath, feel  faint, or pass out.  You have bleeding, swelling larger than a walnut, or drainage from the catheter insertion site.  You develop pain, discoloration, coldness, or severe bruising in the leg or arm that held the catheter.  You develop bleeding from any other place, such as the bowels. You may see bright red blood in your urine or stools, or your stools may appear black and tarry.  You have heavy bleeding from the site. If this happens, hold pressure on the site. MAKE SURE YOU:  Understand these instructions.  Will watch your condition.  Will get help right away if you are not doing well or get worse. Document Released: 06/15/2005 Document Revised: 07/30/2013 Document Reviewed: 04/21/2013 [facility] Patient Information 2014 [place].

## 2014-04-20 ENCOUNTER — Encounter (HOSPITAL_COMMUNITY)
Admission: RE | Disposition: A | Discharge status: Home Health | Payer: Self-pay | Source: Ambulatory Visit | Attending: Orthopedic Surgery

## 2014-04-20 ENCOUNTER — Encounter (HOSPITAL_COMMUNITY): Payer: Self-pay | Admitting: *Deleted

## 2014-04-20 ENCOUNTER — Inpatient Hospital Stay (HOSPITAL_COMMUNITY)
Admission: RE | Admit: 2014-04-20 | Discharge: 2014-04-22 | DRG: 462 | Disposition: A | Discharge status: Home Health | Payer: 59 | Source: Ambulatory Visit | Attending: Orthopedic Surgery | Admitting: Orthopedic Surgery

## 2014-04-20 ENCOUNTER — Inpatient Hospital Stay (HOSPITAL_COMMUNITY): Payer: 59 | Admitting: Certified Registered"

## 2014-04-20 ENCOUNTER — Encounter (HOSPITAL_COMMUNITY): Payer: 59 | Admitting: Certified Registered"

## 2014-04-20 DIAGNOSIS — D62 Acute posthemorrhagic anemia: Secondary | ICD-10-CM | POA: Diagnosis not present

## 2014-04-20 DIAGNOSIS — D0590 Unspecified type of carcinoma in situ of unspecified breast: Secondary | ICD-10-CM | POA: Diagnosis present

## 2014-04-20 DIAGNOSIS — Z880 Allergy status to penicillin: Secondary | ICD-10-CM

## 2014-04-20 DIAGNOSIS — M179 Osteoarthritis of knee, unspecified: Secondary | ICD-10-CM

## 2014-04-20 DIAGNOSIS — M47817 Spondylosis without myelopathy or radiculopathy, lumbosacral region: Secondary | ICD-10-CM | POA: Diagnosis present

## 2014-04-20 DIAGNOSIS — M1710 Unilateral primary osteoarthritis, unspecified knee: Principal | ICD-10-CM | POA: Diagnosis present

## 2014-04-20 DIAGNOSIS — M79609 Pain in unspecified limb: Secondary | ICD-10-CM | POA: Diagnosis present

## 2014-04-20 DIAGNOSIS — Z885 Allergy status to narcotic agent status: Secondary | ICD-10-CM

## 2014-04-20 DIAGNOSIS — F3289 Other specified depressive episodes: Secondary | ICD-10-CM | POA: Diagnosis present

## 2014-04-20 DIAGNOSIS — C50919 Malignant neoplasm of unspecified site of unspecified female breast: Secondary | ICD-10-CM | POA: Diagnosis present

## 2014-04-20 DIAGNOSIS — E875 Hyperkalemia: Secondary | ICD-10-CM | POA: Diagnosis present

## 2014-04-20 DIAGNOSIS — F329 Major depressive disorder, single episode, unspecified: Secondary | ICD-10-CM | POA: Diagnosis present

## 2014-04-20 DIAGNOSIS — I82509 Chronic embolism and thrombosis of unspecified deep veins of unspecified lower extremity: Secondary | ICD-10-CM | POA: Diagnosis present

## 2014-04-20 DIAGNOSIS — Z888 Allergy status to other drugs, medicaments and biological substances status: Secondary | ICD-10-CM

## 2014-04-20 DIAGNOSIS — Z853 Personal history of malignant neoplasm of breast: Secondary | ICD-10-CM

## 2014-04-20 DIAGNOSIS — Z86718 Personal history of other venous thrombosis and embolism: Secondary | ICD-10-CM

## 2014-04-20 DIAGNOSIS — I739 Peripheral vascular disease, unspecified: Secondary | ICD-10-CM | POA: Diagnosis present

## 2014-04-20 HISTORY — PX: TOTAL KNEE ARTHROPLASTY: SHX125

## 2014-04-20 SURGERY — ARTHROPLASTY, KNEE, BILATERAL, TOTAL
Anesthesia: Regional | Site: Knee | Laterality: Bilateral

## 2014-04-20 MED ORDER — DEXAMETHASONE 4 MG PO TABS
10.0000 mg | ORAL_TABLET | Freq: Three times a day (TID) | ORAL | Status: AC
  Administered 2014-04-20 – 2014-04-21 (×3): 10 mg via ORAL
  Filled 2014-04-20 (×3): qty 1

## 2014-04-20 MED ORDER — VANCOMYCIN HCL 1000 MG IV SOLR
1000.0000 mg | INTRAVENOUS | Status: DC | PRN
  Administered 2014-04-20: 1000 mg via INTRAVENOUS

## 2014-04-20 MED ORDER — DEXAMETHASONE SODIUM PHOSPHATE 10 MG/ML IJ SOLN
10.0000 mg | Freq: Three times a day (TID) | INTRAMUSCULAR | Status: AC
  Filled 2014-04-20 (×3): qty 1

## 2014-04-20 MED ORDER — DEXAMETHASONE SODIUM PHOSPHATE 4 MG/ML IJ SOLN
INTRAMUSCULAR | Status: DC | PRN
  Administered 2014-04-20: 10 mg via INTRAVENOUS

## 2014-04-20 MED ORDER — CALCIUM-VITAMIN D-VITAMIN K 500-1000-40 MG-UNT-MCG PO CHEW: 1.0000 | CHEWABLE_TABLET | Freq: Every day | ORAL | Status: DC

## 2014-04-20 MED ORDER — PROPOFOL 10 MG/ML IV BOLUS
INTRAVENOUS | Status: DC | PRN
  Administered 2014-04-20: 200 mg via INTRAVENOUS

## 2014-04-20 MED ORDER — DIPHENHYDRAMINE HCL 12.5 MG/5ML PO ELIX: 12.5000 mg | ORAL_SOLUTION | ORAL | Status: DC | PRN

## 2014-04-20 MED ORDER — SODIUM CHLORIDE 0.9 % IR SOLN
Status: DC | PRN
  Administered 2014-04-20: 3000 mL

## 2014-04-20 MED ORDER — POTASSIUM CHLORIDE IN NACL 20-0.9 MEQ/L-% IV SOLN
INTRAVENOUS | Status: DC
  Administered 2014-04-20: 23:00:00 via INTRAVENOUS
  Filled 2014-04-20 (×6): qty 1000

## 2014-04-20 MED ORDER — PROPOFOL 10 MG/ML IV BOLUS
INTRAVENOUS | Status: AC
  Filled 2014-04-20: qty 20

## 2014-04-20 MED ORDER — BUPIVACAINE-EPINEPHRINE 0.25% -1:200000 IJ SOLN
INTRAMUSCULAR | Status: DC | PRN
  Administered 2014-04-20: 30 mL

## 2014-04-20 MED ORDER — EPHEDRINE SULFATE 50 MG/ML IJ SOLN
INTRAMUSCULAR | Status: DC | PRN
  Administered 2014-04-20 (×2): 5 mg via INTRAVENOUS

## 2014-04-20 MED ORDER — CELECOXIB 200 MG PO CAPS
200.0000 mg | ORAL_CAPSULE | Freq: Two times a day (BID) | ORAL | Status: DC
  Administered 2014-04-20 – 2014-04-22 (×4): 200 mg via ORAL
  Filled 2014-04-20 (×5): qty 1

## 2014-04-20 MED ORDER — PHENOL 1.4 % MT LIQD: 1.0000 | OROMUCOSAL | Status: DC | PRN

## 2014-04-20 MED ORDER — RIVAROXABAN 10 MG PO TABS
10.0000 mg | ORAL_TABLET | Freq: Every day | ORAL | Status: DC
  Administered 2014-04-21 – 2014-04-22 (×2): 10 mg via ORAL
  Filled 2014-04-20 (×3): qty 1

## 2014-04-20 MED ORDER — HYDROMORPHONE HCL PF 1 MG/ML IJ SOLN
INTRAMUSCULAR | Status: AC
  Filled 2014-04-20: qty 1

## 2014-04-20 MED ORDER — OXYCODONE HCL 5 MG PO TABS
5.0000 mg | ORAL_TABLET | ORAL | Status: DC | PRN
  Administered 2014-04-20 (×2): 5 mg via ORAL
  Administered 2014-04-21 – 2014-04-22 (×3): 10 mg via ORAL
  Filled 2014-04-20: qty 2
  Filled 2014-04-20: qty 1
  Filled 2014-04-20 (×2): qty 2

## 2014-04-20 MED ORDER — LIDOCAINE HCL (CARDIAC) 20 MG/ML IV SOLN
INTRAVENOUS | Status: DC | PRN
  Administered 2014-04-20: 100 mg via INTRAVENOUS

## 2014-04-20 MED ORDER — HYDROMORPHONE HCL PF 1 MG/ML IJ SOLN: 0.5000 mg | INTRAMUSCULAR | Status: DC | PRN

## 2014-04-20 MED ORDER — FENTANYL CITRATE 0.05 MG/ML IJ SOLN
INTRAMUSCULAR | Status: AC
  Filled 2014-04-20: qty 5

## 2014-04-20 MED ORDER — METOCLOPRAMIDE HCL 5 MG/ML IJ SOLN: 5.0000 mg | Freq: Three times a day (TID) | INTRAMUSCULAR | Status: DC | PRN

## 2014-04-20 MED ORDER — ONDANSETRON HCL 4 MG/2ML IJ SOLN
INTRAMUSCULAR | Status: DC | PRN
  Administered 2014-04-20: 4 mg via INTRAVENOUS

## 2014-04-20 MED ORDER — ACETAMINOPHEN 650 MG RE SUPP: 650.0000 mg | Freq: Four times a day (QID) | RECTAL | Status: DC | PRN

## 2014-04-20 MED ORDER — ONDANSETRON HCL 4 MG/2ML IJ SOLN: 4.0000 mg | Freq: Four times a day (QID) | INTRAMUSCULAR | Status: DC | PRN

## 2014-04-20 MED ORDER — FENTANYL CITRATE 0.05 MG/ML IJ SOLN
INTRAMUSCULAR | Status: AC
  Administered 2014-04-20: 50 ug
  Filled 2014-04-20: qty 2

## 2014-04-20 MED ORDER — LACTATED RINGERS IV SOLN
INTRAVENOUS | Status: DC
  Administered 2014-04-20: 08:00:00 via INTRAVENOUS

## 2014-04-20 MED ORDER — MIDAZOLAM HCL 5 MG/5ML IJ SOLN
INTRAMUSCULAR | Status: DC | PRN
  Administered 2014-04-20: 2 mg via INTRAVENOUS

## 2014-04-20 MED ORDER — HYDROMORPHONE HCL PF 1 MG/ML IJ SOLN
0.2500 mg | INTRAMUSCULAR | Status: DC | PRN
  Administered 2014-04-20 (×5): 0.5 mg via INTRAVENOUS

## 2014-04-20 MED ORDER — HYDROMORPHONE HCL PF 1 MG/ML IJ SOLN
1.0000 mg | INTRAMUSCULAR | Status: DC | PRN
Start: 2014-04-20 — End: 2014-04-22
  Administered 2014-04-20: 1 mg via INTRAVENOUS
  Filled 2014-04-20: qty 1

## 2014-04-20 MED ORDER — MIDAZOLAM HCL 2 MG/2ML IJ SOLN
INTRAMUSCULAR | Status: AC
  Administered 2014-04-20: 1 mg
  Filled 2014-04-20: qty 2

## 2014-04-20 MED ORDER — LACTATED RINGERS IV SOLN
INTRAVENOUS | Status: DC | PRN
  Administered 2014-04-20 (×3): via INTRAVENOUS

## 2014-04-20 MED ORDER — ONDANSETRON HCL 4 MG/2ML IJ SOLN
INTRAMUSCULAR | Status: AC
  Filled 2014-04-20: qty 2

## 2014-04-20 MED ORDER — BUPIVACAINE-EPINEPHRINE (PF) 0.25% -1:200000 IJ SOLN
INTRAMUSCULAR | Status: AC
  Filled 2014-04-20: qty 30

## 2014-04-20 MED ORDER — LORATADINE 10 MG PO TABS
10.0000 mg | ORAL_TABLET | Freq: Every day | ORAL | Status: DC
  Administered 2014-04-21 – 2014-04-22 (×2): 10 mg via ORAL
  Filled 2014-04-20 (×2): qty 1

## 2014-04-20 MED ORDER — CALCIUM CARBONATE-VITAMIN D 500-200 MG-UNIT PO TABS
1.0000 | ORAL_TABLET | Freq: Every day | ORAL | Status: DC
  Administered 2014-04-21 – 2014-04-22 (×2): 1 via ORAL
  Filled 2014-04-20 (×3): qty 1

## 2014-04-20 MED ORDER — ANASTROZOLE 1 MG PO TABS
1.0000 mg | ORAL_TABLET | Freq: Every day | ORAL | Status: DC
  Administered 2014-04-21 – 2014-04-22 (×2): 1 mg via ORAL
  Filled 2014-04-20 (×2): qty 1

## 2014-04-20 MED ORDER — MENTHOL 3 MG MT LOZG: 1.0000 | LOZENGE | OROMUCOSAL | Status: DC | PRN

## 2014-04-20 MED ORDER — LACTATED RINGERS IV SOLN: INTRAVENOUS | Status: DC | PRN

## 2014-04-20 MED ORDER — CITALOPRAM HYDROBROMIDE 10 MG PO TABS
10.0000 mg | ORAL_TABLET | Freq: Every day | ORAL | Status: DC
  Administered 2014-04-21 – 2014-04-22 (×2): 10 mg via ORAL
  Filled 2014-04-20 (×2): qty 1

## 2014-04-20 MED ORDER — ROPIVACAINE HCL 5 MG/ML IJ SOLN
INTRAMUSCULAR | Status: DC | PRN
  Administered 2014-04-20 (×2): 15 mL via PERINEURAL

## 2014-04-20 MED ORDER — FENTANYL CITRATE 0.05 MG/ML IJ SOLN
INTRAMUSCULAR | Status: DC | PRN
  Administered 2014-04-20 (×10): 25 ug via INTRAVENOUS
  Administered 2014-04-20 (×2): 50 ug via INTRAVENOUS
  Administered 2014-04-20 (×6): 25 ug via INTRAVENOUS

## 2014-04-20 MED ORDER — ALUM & MAG HYDROXIDE-SIMETH 200-200-20 MG/5ML PO SUSP: 30.0000 mL | ORAL | Status: DC | PRN

## 2014-04-20 MED ORDER — MIDAZOLAM HCL 2 MG/2ML IJ SOLN: 0.5000 mg | Freq: Once | INTRAMUSCULAR | Status: DC | PRN

## 2014-04-20 MED ORDER — OXYCODONE HCL 5 MG PO TABS
ORAL_TABLET | ORAL | Status: AC
  Filled 2014-04-20: qty 1

## 2014-04-20 MED ORDER — HYDROMORPHONE HCL PF 1 MG/ML IJ SOLN
INTRAMUSCULAR | Status: AC
Start: 2014-04-20 — End: 2014-04-21
  Filled 2014-04-20: qty 1

## 2014-04-20 MED ORDER — ACETAMINOPHEN 325 MG PO TABS
650.0000 mg | ORAL_TABLET | Freq: Four times a day (QID) | ORAL | Status: DC | PRN
Start: 2014-04-20 — End: 2014-04-22

## 2014-04-20 MED ORDER — PROMETHAZINE HCL 25 MG/ML IJ SOLN: 6.2500 mg | INTRAMUSCULAR | Status: DC | PRN

## 2014-04-20 MED ORDER — METOCLOPRAMIDE HCL 5 MG PO TABS
5.0000 mg | ORAL_TABLET | Freq: Three times a day (TID) | ORAL | Status: DC | PRN
  Filled 2014-04-20: qty 2

## 2014-04-20 MED ORDER — ONDANSETRON HCL 4 MG PO TABS: 4.0000 mg | ORAL_TABLET | Freq: Four times a day (QID) | ORAL | Status: DC | PRN

## 2014-04-20 MED ORDER — OXYCODONE HCL ER 20 MG PO T12A
20.0000 mg | EXTENDED_RELEASE_TABLET | Freq: Two times a day (BID) | ORAL | Status: DC
  Administered 2014-04-20 – 2014-04-22 (×4): 20 mg via ORAL
  Filled 2014-04-20 (×4): qty 1

## 2014-04-20 MED ORDER — DOCUSATE SODIUM 100 MG PO CAPS
100.0000 mg | ORAL_CAPSULE | Freq: Two times a day (BID) | ORAL | Status: DC
  Administered 2014-04-20 – 2014-04-22 (×4): 100 mg via ORAL
  Filled 2014-04-20 (×5): qty 1

## 2014-04-20 MED ORDER — BISACODYL 5 MG PO TBEC
10.0000 mg | DELAYED_RELEASE_TABLET | Freq: Every day | ORAL | Status: DC
  Administered 2014-04-21: 10 mg via ORAL
  Filled 2014-04-20 (×2): qty 2

## 2014-04-20 MED ORDER — VANCOMYCIN HCL IN DEXTROSE 1-5 GM/200ML-% IV SOLN
1000.0000 mg | Freq: Two times a day (BID) | INTRAVENOUS | Status: AC
  Administered 2014-04-20: 1000 mg via INTRAVENOUS
  Filled 2014-04-20: qty 200

## 2014-04-20 MED ORDER — VITAMIN D3 25 MCG (1000 UNIT) PO TABS
1000.0000 [IU] | ORAL_TABLET | Freq: Every day | ORAL | Status: DC
  Administered 2014-04-21 – 2014-04-22 (×2): 1000 [IU] via ORAL
  Filled 2014-04-20 (×2): qty 1

## 2014-04-20 MED ORDER — VANCOMYCIN HCL IN DEXTROSE 1-5 GM/200ML-% IV SOLN
1000.0000 mg | INTRAVENOUS | Status: DC
  Filled 2014-04-20: qty 200

## 2014-04-20 MED ORDER — MEPERIDINE HCL 25 MG/ML IJ SOLN: 6.2500 mg | INTRAMUSCULAR | Status: DC | PRN

## 2014-04-20 MED ORDER — MIDAZOLAM HCL 2 MG/2ML IJ SOLN
INTRAMUSCULAR | Status: AC
  Filled 2014-04-20: qty 2

## 2014-04-20 SURGICAL SUPPLY — 80 items
AUTOTRANSFUSION W/QD PVC DRAIN (AUTOTRANSFUSION) IMPLANT
BANDAGE ESMARK 6X9 LF (GAUZE/BANDAGES/DRESSINGS) ×1 IMPLANT
BLADE SAGITTAL 25.0X1.19X90 (BLADE) ×4 IMPLANT
BLADE SAW SGTL 13.0X1.19X90.0M (BLADE) ×4 IMPLANT
BLADE SURG 10 STRL SS (BLADE) ×3 IMPLANT
BNDG CMPR 9X6 STRL LF SNTH (GAUZE/BANDAGES/DRESSINGS) ×2
BNDG CMPR MED 15X6 ELC VLCR LF (GAUZE/BANDAGES/DRESSINGS) ×2
BNDG COHESIVE 6X5 TAN STRL LF (GAUZE/BANDAGES/DRESSINGS) ×3 IMPLANT
BNDG ELASTIC 6X15 VLCR STRL LF (GAUZE/BANDAGES/DRESSINGS) ×4 IMPLANT
BNDG ESMARK 6X9 LF (GAUZE/BANDAGES/DRESSINGS) ×4
BOWL SMART MIX CTS (DISPOSABLE) ×4 IMPLANT
CAPT RP KNEE ×2 IMPLANT
CEMENT HV SMART SET (Cement) ×8 IMPLANT
COVER BACK TABLE 24X17X13 BIG (DRAPES) IMPLANT
COVER SURGICAL LIGHT HANDLE (MISCELLANEOUS) ×2 IMPLANT
CUFF TOURNIQUET SINGLE 34IN LL (TOURNIQUET CUFF) ×2 IMPLANT
CUFF TOURNIQUET SINGLE 44IN (TOURNIQUET CUFF) IMPLANT
DRAPE EXTREMITY BILATERAL (DRAPE) ×2 IMPLANT
DRAPE INCISE IOBAN 66X45 STRL (DRAPES) ×3 IMPLANT
DRAPE ORTHO SPLIT 77X108 STRL (DRAPES) ×2
DRAPE PROXIMA HALF (DRAPES) ×5 IMPLANT
DRAPE SURG ORHT 6 SPLT 77X108 (DRAPES) IMPLANT
DRAPE U-SHAPE 47X51 STRL (DRAPES) ×4 IMPLANT
DRSG ADAPTIC 3X8 NADH LF (GAUZE/BANDAGES/DRESSINGS) ×4 IMPLANT
DRSG MEPILEX BORDER 4X12 (GAUZE/BANDAGES/DRESSINGS) ×1 IMPLANT
DRSG PAD ABDOMINAL 8X10 ST (GAUZE/BANDAGES/DRESSINGS) ×10 IMPLANT
DURAPREP 26ML APPLICATOR (WOUND CARE) ×6 IMPLANT
ELECT CAUTERY BLADE 6.4 (BLADE) ×2 IMPLANT
ELECT REM PT RETURN 9FT ADLT (ELECTROSURGICAL) ×2
ELECTRODE REM PT RTRN 9FT ADLT (ELECTROSURGICAL) ×1 IMPLANT
EVACUATOR 1/8 PVC DRAIN (DRAIN) ×2 IMPLANT
FACESHIELD WRAPAROUND (MASK) IMPLANT
FACESHIELD WRAPAROUND OR TEAM (MASK) ×1 IMPLANT
GLOVE BIO SURGEON STRL SZ7 (GLOVE) ×3 IMPLANT
GLOVE BIO SURGEON STRL SZ7.5 (GLOVE) ×1 IMPLANT
GLOVE BIOGEL PI IND STRL 7.0 (GLOVE) ×1 IMPLANT
GLOVE BIOGEL PI IND STRL 7.5 (GLOVE) ×1 IMPLANT
GLOVE BIOGEL PI INDICATOR 7.0 (GLOVE) ×4
GLOVE BIOGEL PI INDICATOR 7.5 (GLOVE) ×2
GLOVE ECLIPSE 6.5 STRL STRAW (GLOVE) ×1 IMPLANT
GLOVE SS BIOGEL STRL SZ 7.5 (GLOVE) ×1 IMPLANT
GLOVE SUPERSENSE BIOGEL SZ 7.5 (GLOVE) ×2
GOWN STRL REUS W/ TWL LRG LVL3 (GOWN DISPOSABLE) ×2 IMPLANT
GOWN STRL REUS W/ TWL XL LVL3 (GOWN DISPOSABLE) ×2 IMPLANT
GOWN STRL REUS W/TWL LRG LVL3 (GOWN DISPOSABLE) ×4
GOWN STRL REUS W/TWL MED LVL3 (GOWN DISPOSABLE) ×1 IMPLANT
GOWN STRL REUS W/TWL XL LVL3 (GOWN DISPOSABLE) ×6
HANDPIECE INTERPULSE COAX TIP (DISPOSABLE) ×2
HOOD PEEL AWAY FACE SHEILD DIS (HOOD) ×6 IMPLANT
IMMOBILIZER KNEE 22 UNIV (SOFTGOODS) ×2 IMPLANT
KIT BASIN OR (CUSTOM PROCEDURE TRAY) ×2 IMPLANT
KIT ROOM TURNOVER OR (KITS) ×2 IMPLANT
MANIFOLD NEPTUNE II (INSTRUMENTS) ×2 IMPLANT
NDL 18GX1X1/2 (RX/OR ONLY) (NEEDLE) ×1 IMPLANT
NEEDLE 18GX1X1/2 (RX/OR ONLY) (NEEDLE) ×2 IMPLANT
PACK TOTAL JOINT (CUSTOM PROCEDURE TRAY) ×2 IMPLANT
PAD ABD 8X10 STRL (GAUZE/BANDAGES/DRESSINGS) ×2 IMPLANT
PAD ARMBOARD 7.5X6 YLW CONV (MISCELLANEOUS) ×4 IMPLANT
PAD CAST 4YDX4 CTTN HI CHSV (CAST SUPPLIES) ×2 IMPLANT
PADDING CAST COTTON 4X4 STRL (CAST SUPPLIES)
PADDING CAST COTTON 6X4 STRL (CAST SUPPLIES) ×4 IMPLANT
RUBBERBAND STERILE (MISCELLANEOUS) ×3 IMPLANT
SET HNDPC FAN SPRY TIP SCT (DISPOSABLE) ×1 IMPLANT
SPONGE GAUZE 4X4 12PLY (GAUZE/BANDAGES/DRESSINGS) ×4 IMPLANT
SPONGE GAUZE 4X4 12PLY STER LF (GAUZE/BANDAGES/DRESSINGS) ×2 IMPLANT
STOCKINETTE IMPERVIOUS 9X36 MD (GAUZE/BANDAGES/DRESSINGS) ×3 IMPLANT
STRIP CLOSURE SKIN 1/2X4 (GAUZE/BANDAGES/DRESSINGS) ×3 IMPLANT
SUCTION FRAZIER TIP 10 FR DISP (SUCTIONS) ×2 IMPLANT
SUT ETHIBOND NAB CT1 #1 30IN (SUTURE) ×8 IMPLANT
SUT MNCRL AB 3-0 PS2 18 (SUTURE) ×4 IMPLANT
SUT VIC AB 0 CT1 27 (SUTURE) ×12
SUT VIC AB 0 CT1 27XBRD ANBCTR (SUTURE) ×4 IMPLANT
SUT VIC AB 2-0 CT1 27 (SUTURE) ×8
SUT VIC AB 2-0 CT1 TAPERPNT 27 (SUTURE) ×4 IMPLANT
SYR 30ML LL (SYRINGE) ×1 IMPLANT
SYR 30ML SLIP (SYRINGE) ×2 IMPLANT
TOWEL OR 17X24 6PK STRL BLUE (TOWEL DISPOSABLE) ×2 IMPLANT
TOWEL OR 17X26 10 PK STRL BLUE (TOWEL DISPOSABLE) ×2 IMPLANT
TRAY FOLEY CATH 16FRSI W/METER (SET/KITS/TRAYS/PACK) ×2 IMPLANT
WATER STERILE IRR 1000ML POUR (IV SOLUTION) ×4 IMPLANT

## 2014-04-20 NOTE — Anesthesia Procedure Notes (Signed)
Anesthesia Regional Block:  Adductor canal block  Pre-Anesthetic Checklist: ,, timeout performed, Correct Patient, Correct Site, Correct Laterality, Correct Procedure, Correct Position, site marked, Risks and benefits discussed,  Surgical consent,  Pre-op evaluation,  At surgeon's request and post-op pain management  Laterality: Left and Right  Prep: chloraprep       Needles:  Injection technique: Single-shot  Needle Type: Stimiplex          Additional Needles:  Procedures: ultrasound guided (picture in chart) Adductor canal block Narrative:  Start time: 04/20/2014 8:35 AM End time: 04/20/2014 8:50 AM Injection made incrementally with aspirations every 5 mL.  Performed by: Personally   Additional Notes: Risks, benefits and alternative to block explained extensively.  Patient tolerated procedure well, without complications.  Bilateral (69ml on each side)

## 2014-04-20 NOTE — H&P (View-Only) (Signed)
TOTAL KNEE ADMISSION H&P  Patient is being admitted for bilaterally total knee arthroplasty.  Subjective:  Chief Complaint:bilaterally knee pain.  HPI: Mary Bridges, 58 y.o. female, has a history of pain and functional disability in the bilaterally knee due to arthritis and has failed non-surgical conservative treatments for greater than 12 weeks to includeNSAID's and/or analgesics, corticosteriod injections, viscosupplementation injections, flexibility and strengthening excercises, supervised PT with diminished ADL's post treatment, use of assistive devices, weight reduction as appropriate and activity modification.  Onset of symptoms was gradual, starting 5 years ago with gradually worsening course since that time. The patient noted prior procedures on the knee to include  arthroscopy and menisectomy on the right knee(s).  Patient currently rates pain in the bilaterally knee(s) at 10 out of 10 with activity. Patient has night pain, worsening of pain with activity and weight bearing, pain that interferes with activities of daily living, pain with passive range of motion, crepitus and joint swelling.  Patient has evidence of subchondral sclerosis, periarticular osteophytes and joint space narrowing by imaging studies.. There is no active infection.  Patient Active Problem List   Diagnosis Date Noted  . DJD (degenerative joint disease) of knee   . DJD (degenerative joint disease), lumbosacral   . Arthritis   . Pain in limb 03/05/2014  . Leg DVT (deep venous thromboembolism), chronic 03/05/2014  . Arthritis of knee 02/04/2014  . 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 Breast cancer, Right 04/21/2009  . History of blood clots 05/11/1992  . Kidney infection 09/10/1981   Past Medical History  Diagnosis Date  . Kidney infection 09/1981    during first pregnancy  . History of blood clots 05/1992    Left leg  . Breast cancer, Right 04/21/2009  . Arthritis    Knees  . DJD (degenerative joint disease) of knee   . DJD (degenerative joint disease), lumbosacral     Past Surgical History  Procedure Laterality Date  . Mastectomy  09/22/09    Bilateral  . Wisdom tooth extraction      in her teens  . Novasure ablation  09/2005  . Eye surgery      Lasix  . Port-a-cath removal  07/07/10  . Abdominal hysterectomy    . Meniscus repair Right 03/2012  . Gum surgery  2014    Gum Graft     (Not in a hospital admission) Allergies  Allergen Reactions  . Penicillins Hives  . Codeine Rash  . Erythromycin Rash    History  Substance Use Topics  . Smoking status: Not on file  . Smokeless tobacco: Never Used  . Alcohol Use: No    Family History  Problem Relation Age of Onset  . Cancer Father     Prostate, bone  . Stroke Mother      Review of Systems  Constitutional: Negative.   HENT: Negative.   Eyes: Negative.   Respiratory: Negative.   Cardiovascular: Negative.   Gastrointestinal: Negative.   Genitourinary: Negative.   Musculoskeletal: Positive for back pain and joint pain.  Skin: Negative.   Neurological: Negative.   Endo/Heme/Allergies: Negative.   Psychiatric/Behavioral: Negative.     Objective:  Physical Exam  Constitutional: She is oriented to person, place, and time. She appears well-developed and well-nourished.  HENT:  Head: Normocephalic and atraumatic.  Eyes: Conjunctivae and EOM are normal. Pupils are equal, round, and reactive to light.  Neck: Normal range of motion. Neck supple.  Cardiovascular:  Normal rate, regular rhythm and normal heart sounds.   Respiratory: Effort normal and breath sounds normal.  GI: Soft. Bowel sounds are normal.  Genitourinary:  Not pertinent to current symptomatology therefore not examined.  Musculoskeletal:  Examination of both knees reveal 1+ crepitation.  1+ synovitis.  Diffuse pain, left worse than right.  Range of motion -5 to 125 degrees.  Knees are stable with normal patella  tracking.  She has chronic swelling in her left calf and she said that she has noticed that this is somewhat larger than the right.  She has a remote history of a DVT postpartum in 1993.  She does have ipsilateral SLR pain on the left.  Neg on the right.  Neurological: She is alert and oriented to person, place, and time.  Skin: Skin is warm and dry.  Psychiatric: She has a normal mood and affect. Her behavior is normal.    Vital signs in last 24 hours: Last recorded: 04/29 1500   BP: 129/89 Pulse: 76  Temp: 98.4 F (36.9 C)    Height: _0  (1.702 m) SpO2: 97  Weight: 108.41 kg (239 lb)     Labs:   Estimated body mass index is 37.42 kg/(m^2) as calculated from the following:   Height as of this encounter: _1  (1.702 m).   Weight as of this encounter: 108.41 kg (239 lb).   Imaging Review Plain radiographs demonstrate severe degenerative joint disease of the bilaterally knee(s). The overall alignment issignificant varus. The bone quality appears to be good for age and reported activity level.  Assessment/Plan:  End stage arthritis, bilaterally knee   The patient history, physical examination, clinical judgment of the provider and imaging studies are consistent with end stage degenerative joint disease of the bilaterally knee(s) and total knee arthroplasty is deemed medically necessary. The treatment options including medical management, injection therapy arthroscopy and arthroplasty were discussed at length. The risks and benefits of total knee arthroplasty were presented and reviewed. The risks due to aseptic loosening, infection, stiffness, patella tracking problems, thromboembolic complications and other imponderables were discussed. The patient acknowledged the explanation, agreed to proceed with the plan and consent was signed. Patient is being admitted for inpatient treatment for surgery, pain control, PT, OT, prophylactic antibiotics, VTE prophylaxis, progressive ambulation  and ADL's and discharge planning. The patient is planning to be discharged home with home health services  Ieisha Gao A. Kaleen Mask Physician Assistant [facility] [phone]  04/08/2014, 3:16 PM

## 2014-04-20 NOTE — Anesthesia Postprocedure Evaluation (Signed)
  Anesthesia Post Note  Patient: Mary Bridges  Procedure(s) Performed: Procedure(s) (LRB): BILATERAL TOTAL KNEE ARTHROPLASTY  (Bilateral)  Anesthesia type: GA  Patient location: PACU  Post pain: Pain level controlled  Post assessment: Post-op Vital signs reviewed  Last Vitals:  Filed Vitals:   04/20/14 1330  BP:   Pulse:   Temp: 36.4 C  Resp:     Post vital signs: Reviewed  Level of consciousness: sedated  Complications: No apparent anesthesia complications

## 2014-04-20 NOTE — Op Note (Signed)
MRN:     [id number] DOB/AGE:    1956/04/08 / 58 y.o.       OPERATIVE REPORT    DATE OF PROCEDURE:  04/20/2014       PREOPERATIVE DIAGNOSIS:   degenerative joint disease bilateral knee      Estimated body mass index is 36.45 kg/(m^2) as calculated from the following:   Height as of 04/17/14: 5\' 8"  (1.727 m).   Weight as of 03/05/14: 108.727 kg (239 lb 11.2 oz).                                                        POSTOPERATIVE DIAGNOSIS:  Right knee DJD,  Left knee DJD                                                                     PROCEDURE:  Procedure(s): RIGHT TOTAL KNEE ARTHROPLASTY  Using Depuy Sigma RP implants #4 Narrow Femur, #3Tibia, 80mm sigma RP bearing, 32 Patella LEFT TOTAL KNEE ARTHROPLASTY  Using Depuy Sigma RP implants #4 Narrow Femur, #3 Tibia, 54mm sigma RP bearing, 32 Patella     SURGEON: Lorn Junes    ASSISTANT:  Kirstin Shepperson PA-C   (Present and scrubbed throughout the case, critical for assistance with exposure, retraction, instrumentation, and closure.)         ANESTHESIA: GET with Femoral Nerve Block  DRAINS: foley, 2 medium hemovac in each  knee   TOURNIQUET TIME: 30min left,   90 min right   COMPLICATIONS:  None     SPECIMENS: None   INDICATIONS FOR PROCEDURE: The patient has  degenerative joint disease bilateral knee, varus deformities, XR shows bone on bone arthritis. Patient has failed all conservative measures including anti-inflammatory medicines, narcotics, attempts at  exercise and weight loss, cortisone injections and viscosupplementation.  Risks and benefits of surgery have been discussed, questions answered.   DESCRIPTION OF PROCEDURE: The patient identified by armband, received  Right and left femoral nerve blocks and IV antibiotics, in the holding area at [facility]. Patient taken to the operating room, appropriate anesthetic  monitors were attached General endotracheal anesthesia induced with  the patient in supine  position, Foley catheter was inserted. Tourniquet  applied high to the operative thigh. Lateral post and foot positioner  applied to the table, the lower extremities were then prepped and draped  in usual sterile fashion from the ankle to the tourniquet. Time-out procedure was performed. The left limb was first addressed.  It was wrapped with an Esmarch bandage and the tourniquet inflated to 365 mmHg. We began the operation by making the anterior midline incision starting at handbreadth above the patella going over the patella 1 cm medial to and  4 cm distal to the tibial tubercle. Small bleeders in the skin and the  subcutaneous tissue identified and cauterized. Transverse retinaculum was incised and reflected medially and a medial parapatellar arthrotomy was accomplished. the patella was everted and theprepatellar fat pad resected. The superficial medial collateral  ligament was then elevated from anterior to posterior along the proximal  flare  of the tibia and anterior half of the menisci resected. The knee was hyperflexed exposing bone on bone arthritis. Peripheral and notch osteophytes as well as the cruciate ligaments were then resected. We continued to work our way around posteriorly along the proximal tibia, and externally rotated the tibia subluxing it out from underneath the femur. A McHale retractor was placed through the notch and a lateral Hohmann retractor placed, and we then drilled through the proximal tibia in line with the axis of the tibia followed by an intramedullary guide rod and 2-degree  posterior slope cutting guide. The tibial cutting guide was pinned into place allowing resection of 4 mm of bone medially and about 6 mm of bone  laterally because of her varus deformity. Satisfied with the tibial resection, we then entered the distal femur 2 mm anterior to the PCL origin with the  intramedullary guide rod and applied the distal femoral cutting guide set at 34mm, with 5 degrees of  valgus. This was pinned along the  epicondylar axis. At this point, the distal femoral cut was accomplished without difficulty. We then sized for a #4 narrow femoral component and pinned the guide in 3 degrees of external rotation.The chamfer cutting guide was pinned into place. The anterior, posterior, and chamfer cuts were accomplished without difficulty followed by  the Sigma RP box cutting guide and the box cut. We also removed posterior osteophytes from the posterior femoral condyles. At this  time, the knee was brought into full extension. We checked our extension and flexion gaps and found them symmetric at 52mm.  The patella thickness measured at 21 mm. We set the cutting guide at 12 and removed the posterior 9 mm  of the patella sized for 32 button and drilled the lollipop. The knee  was then once again hyperflexed exposing the proximal tibia. We sized for a #3 tibial base plate, applied the smokestack and the conical reamer followed by the the Delta fin keel punch. We then hammered into place the Sigma RP trial femoral component, inserted a 10-mm trial bearing, trial patellar button, and took the knee through range of motion from 0-130 degrees. No thumb pressure was required for patellar  tracking. At this point, all trial components were removed, a double batch of DePuy HV cement  was mixed and applied to all bony metallic mating surfaces except for the posterior condyles of the femur itself. In order, we  hammered into place the tibial tray and removed excess cement, the femoral component and removed excess cement, a 10-mm Sigma RP bearing  was inserted, and the knee brought to full extension with compression.  The patellar button was clamped into place, and excess cement  removed. While the cement cured the wound was irrigated out with normal saline solution pulse lavage, and medium Hemovac drains were placed.. Ligament stability and patellar tracking were checked and found to be excellent.  The tourniquet was then released and hemostasis was obtained with cautery. The parapatellar arthrotomy was closed with  #1 ethibond suture. The subcutaneous tissue with 0 and 2-0 undyed Vicryl suture, and 4-0 Monocryl..  The right knee was wrapped with an Esmarch bandage and the tourniquet inflated to 365 mmHg. We began the operation on the right knee by making the anterior midline incision starting at handbreadth above the patella going over the patella 1 cm medial to and  4 cm distal to the tibial tubercle. Small bleeders in the skin and the  subcutaneous tissue identified and cauterized. Transverse retinaculum  was incised and reflected medially and a medial parapatellar arthrotomy was accomplished. the patella was everted and theprepatellar fat pad resected. The superficial medial collateral  ligament was then elevated from anterior to posterior along the proximal  flare of the tibia and anterior half of the menisci resected. The knee was hyperflexed exposing bone on bone arthritis. Peripheral and notch osteophytes as well as the cruciate ligaments were then resected. We continued to work our way around posteriorly along the proximal tibia, and externally rotated the tibia subluxing it out from underneath the femur. A McHale retractor was placed through the notch and a lateral Hohmann retractor placed, and we then drilled through the proximal tibia in line with the axis of the tibia followed by an intramedullary guide rod and 2-degree posterior slope cutting guide. The tibial cutting guide was pinned into placeallowing resection of 4 mm of bone medially and about 6 mm of bone laterally because of her varus deformity. Satisfied with the tibial resection, we then  entered the distal femur 2 mm anterior to the PCL origin with the intramedullary guide rod and applied the distal femoral cutting guide  set at 55mm, with 5 degrees of valgus. This was pinned along the epicondylar axis. At this point, the distal  femoral cut was accomplished without difficulty. We then sized for a #4 narrow femoral component and pinned the guide in 3 degrees of external rotation.The chamfer cutting guide was pinned into place. The anterior, posterior, and chamfer cuts were accomplished without difficulty followed by  the Sigma RP box cutting guide and the box cut. We also removed posterior osteophytes from the posterior femoral condyles. At this time, the knee was brought into full extension. We checked our  extension and flexion gaps and found them symmetric at 87mm. The patella thickness measured at 21 mm. We set the cutting guide at 12 and removed the posterior 9 mm  of the patella sized for 32 button and drilled the lollipop. The knee was then once again hyperflexed exposing the proximal tibia. We sized for a #3 tibial base plate, applied the smokestack and the conical reamer followed by the the Delta fin keel punch. We then hammered into place the Sigma RP trial femoral component, inserted a 10-mm trial bearing, trial patellar button, and took the knee through range of motion from 0-130 degrees. No thumb pressure was required for patellar tracking. At this point, all trial components were removed, a double batch of DePuy HV cement  was mixed and applied to all bony metallic mating surfaces except for the posterior condyles of the femur itself. In order, we  hammered into place the tibial tray and removed excess cement, the femoral component and removed excess cement, a 10-mm Sigma RP bearing  was inserted, and the knee brought to full extension with compression. The patellar button was clamped into place, and excess cement  removed. While the cement cured the wound was irrigated out with normal saline solution pulse lavage, and medium Hemovac drains were placed.. Ligament stability and patellar tracking were checked and found to be excellent. The tourniquet was then released and hemostasis was obtained with cautery. The  parapatellar arthrotomy was closed with  #1 ethibond suture over 2 hemovac drains. . The subcutaneous tissue with 0 and 2-0 undyed vycryl and  monocryl.  A dressing of Xeroform,  4 x 4, dressing sponges, Webril, and Ace wrap applied. Needle and sponge count were correct times 2.The patient awakened, extubated, and taken to recovery room without  difficulty. Vascular status was normal, pulses 2+ and symmetric.   Lorn Junes 04/20/2014, 5:34 PM

## 2014-04-20 NOTE — Progress Notes (Signed)
Orthopedic Tech Progress Note Patient Details:  Mary Bridges 1956/07/21 [id number] Bilateral CPM applied with appropriate settings. OHF applied to bed. No footsie roll available at this time. CPM Left Knee CPM Left Knee: On Left Knee Flexion (Degrees): 60 Left Knee Extension (Degrees): 0 CPM Right Knee CPM Right Knee: On Right Knee Flexion (Degrees): 60 Right Knee Extension (Degrees): 0   Asia R Thompson 04/20/2014, 3:08 PM

## 2014-04-20 NOTE — Anesthesia Preprocedure Evaluation (Signed)
Anesthesia Evaluation  Patient identified by MRN, date of birth, ID band Patient awake    Reviewed: Allergy & Precautions, H&P , Patient's Chart, lab work & pertinent test results, reviewed documented beta blocker date and time   History of Anesthesia Complications Negative for: history of anesthetic complications  Airway Mallampati: II TM Distance: >3 FB Neck ROM: full    Dental   Pulmonary  breath sounds clear to auscultation        Cardiovascular Exercise Tolerance: Good + Peripheral Vascular Disease Rhythm:regular Rate:Normal     Neuro/Psych PSYCHIATRIC DISORDERS Depression negative psych ROS   GI/Hepatic   Endo/Other    Renal/GU Renal disease     Musculoskeletal   Abdominal   Peds  Hematology   Anesthesia Other Findings   Reproductive/Obstetrics                           Anesthesia Physical Anesthesia Plan  ASA: II  Anesthesia Plan: General LMA and Regional   Post-op Pain Management:    Induction:   Airway Management Planned:   Additional Equipment:   Intra-op Plan:   Post-operative Plan:   Informed Consent: I have reviewed the patients History and Physical, chart, labs and discussed the procedure including the risks, benefits and alternatives for the proposed anesthesia with the patient or authorized representative who has indicated his/her understanding and acceptance.   Dental Advisory Given  Plan Discussed with: CRNA, Surgeon and Anesthesiologist  Anesthesia Plan Comments:         Anesthesia Quick Evaluation

## 2014-04-20 NOTE — Interval H&P Note (Signed)
History and Physical Interval Note:  04/20/2014 8:55 AM  Mary Bridges  has presented today for surgery, with the diagnosis of djd bilateral knee  The various methods of treatment have been discussed with the patient and family. After consideration of risks, benefits and other options for treatment, the patient has consented to  Procedure(s): BILATERAL TOTAL KNEE ARTHROPLASTY  (Bilateral) as a surgical intervention .  The patient's history has been reviewed, patient examined, no change in status, stable for surgery.  I have reviewed the patient's chart and labs.  Questions were answered to the patient's satisfaction.     Lorn Junes

## 2014-04-20 NOTE — Transfer of Care (Signed)
Immediate Anesthesia Transfer of Care Note  Patient: Mary Bridges  Procedure(s) Performed: Procedure(s): BILATERAL TOTAL KNEE ARTHROPLASTY  (Bilateral)  Patient Location: PACU  Anesthesia Type:General  Level of Consciousness: awake, alert  and oriented  Airway & Oxygen Therapy: Patient Spontanous Breathing  Post-op Assessment: Report given to PACU RN  Post vital signs: Reviewed and stable  Complications: No apparent anesthesia complications

## 2014-04-20 NOTE — Progress Notes (Signed)
Have settled pt in room and checked vitals. Waiting to give report to [facility]

## 2014-04-21 ENCOUNTER — Encounter (HOSPITAL_COMMUNITY): Payer: Self-pay | Admitting: General Practice

## 2014-04-21 DIAGNOSIS — D62 Acute posthemorrhagic anemia: Secondary | ICD-10-CM | POA: Diagnosis not present

## 2014-04-21 DIAGNOSIS — E875 Hyperkalemia: Secondary | ICD-10-CM | POA: Diagnosis not present

## 2014-04-21 LAB — COMPREHENSIVE METABOLIC PANEL
ALK PHOS: 95 U/L (ref 39–117)
ALT: 19 U/L (ref 0–35)
AST: 18 U/L (ref 0–37)
Albumin: 3 g/dL — ABNORMAL LOW (ref 3.5–5.2)
BILIRUBIN TOTAL: 0.7 mg/dL (ref 0.3–1.2)
BUN: 17 mg/dL (ref 6–23)
CHLORIDE: 98 meq/L (ref 96–112)
CO2: 23 meq/L (ref 19–32)
Calcium: 8.5 mg/dL (ref 8.4–10.5)
Creatinine, Ser: 0.77 mg/dL (ref 0.50–1.10)
GFR calc Af Amer: >90 mL/min (ref >90)
GLUCOSE: 157 mg/dL — AB (ref 70–99)
POTASSIUM: 4.6 meq/L (ref 3.7–5.3)
Sodium: 135 mEq/L — ABNORMAL LOW (ref 137–147)
Total Protein: 5.8 g/dL — ABNORMAL LOW (ref 6.0–8.3)

## 2014-04-21 LAB — CBC
HCT: 23.1 % — ABNORMAL LOW (ref 36.0–46.0)
HCT: 28.2 % — ABNORMAL LOW (ref 36.0–46.0)
HEMOGLOBIN: 9.4 g/dL — AB (ref 12.0–15.0)
Hemoglobin: 7.6 g/dL — ABNORMAL LOW (ref 12.0–15.0)
MCH: 29.3 pg (ref 26.0–34.0)
MCH: 29.6 pg (ref 26.0–34.0)
MCHC: 32.9 g/dL (ref 30.0–36.0)
MCHC: 33.3 g/dL (ref 30.0–36.0)
MCV: 89.2 fL (ref 78.0–100.0)
MCV: 88.7 fL (ref 78.0–100.0)
PLATELETS: 216 10*3/uL (ref 150–400)
Platelets: 157 10*3/uL (ref 150–400)
RBC: 2.59 MIL/uL — ABNORMAL LOW (ref 3.87–5.11)
RBC: 3.18 MIL/uL — AB (ref 3.87–5.11)
RDW: 13.2 % (ref 11.5–15.5)
RDW: 13.2 % (ref 11.5–15.5)
WBC: 9.4 10*3/uL (ref 4.0–10.5)
WBC: 13.4 10*3/uL — AB (ref 4.0–10.5)

## 2014-04-21 LAB — BASIC METABOLIC PANEL
BUN: 13 mg/dL (ref 6–23)
CHLORIDE: 116 meq/L — AB (ref 96–112)
CO2: 17 mEq/L — ABNORMAL LOW (ref 19–32)
Calcium: 6 mg/dL — CL (ref 8.4–10.5)
Creatinine, Ser: 0.6 mg/dL (ref 0.50–1.10)
GFR calc Af Amer: >90 mL/min (ref >90)
GFR calc non Af Amer: >90 mL/min (ref >90)
GLUCOSE: 134 mg/dL — AB (ref 70–99)
SODIUM: 139 meq/L (ref 137–147)

## 2014-04-21 LAB — PREPARE RBC (CROSSMATCH)

## 2014-04-21 MED ORDER — ROPINIROLE HCL 1 MG PO TABS
1.0000 mg | ORAL_TABLET | Freq: Every day | ORAL | Status: DC
  Administered 2014-04-21: 1 mg via ORAL
  Filled 2014-04-21 (×2): qty 1

## 2014-04-21 MED ORDER — FUROSEMIDE 10 MG/ML IJ SOLN: 20.0000 mg | Freq: Once | INTRAMUSCULAR | Status: DC

## 2014-04-21 NOTE — Progress Notes (Signed)
Utilization review completed.  

## 2014-04-21 NOTE — Progress Notes (Signed)
Subjective: 1 Day Post-Op Procedure(s) (LRB): BILATERAL TOTAL KNEE ARTHROPLASTY  (Bilateral) Patient reports pain as 3 on 0-10 scale.    Objective: Vital signs in last 24 hours: Temp:  [97.5 F (36.4 C)-98.3 F (36.8 C)] 98.1 F (36.7 C) (05/12 0512) Pulse Rate:  [71-100] 85 (05/12 0512) Resp:  [12-24] 18 (05/12 0512) BP: (104-158)/(70-86) 134/83 mmHg (05/12 0512) SpO2:  [96 %-100 %] 100 % (05/12 0512)  Intake/Output from previous day: 05/11 0701 - 05/12 0700 In: 3440 [P.O.:440; I.V.:3000] Out: 2215 [Urine:1540; Drains:525; Blood:150] Intake/Output this shift:     Recent Labs  04/21/14 0520  HGB 7.6*    Recent Labs  04/21/14 0520  WBC 9.4  RBC 2.59*  HCT 23.1*  PLT 157    Recent Labs  04/21/14 0520  NA 139  K >7.7*  CL 116*  CO2 17*  BUN 13  CREATININE 0.60  GLUCOSE 134*  CALCIUM 6.0*   No results found for this basename: LABPT, INR,  in the last 72 hours  Neurologically intact ABD soft Neurovascular intact Sensation intact distally Intact pulses distally Dorsiflexion/Plantar flexion intact Incision: dressing C/D/I  Assessment/Plan: 1 Day Post-Op Procedure(s) (LRB): BILATERAL TOTAL KNEE ARTHROPLASTY  (Bilateral) Principal Problem:   DJD (degenerative joint disease) of knee Active Problems:   hx: breast cancer, left DCIS, right IDC, receptor + her2 -   Breast cancer, Right   Pain in limb   Leg DVT (deep venous thromboembolism), chronic   History of blood clots   DJD (degenerative joint disease), lumbosacral   Hyperkalemia   Postoperative anemia due to acute blood loss   Hypocalcemia  Advance diet Up with therapy Plan for discharge tomorrow Saline lock IV until new labs are drawn.  Call IV team to start IV higher in the arm with a large bore due to the need for blood. Stat redraw of all labs.  They were drawn in the hand with the IV because the other arm is a no stick due to breast cancer EKG shows no rhythm abnormalities UP with  therapy today Home tomorrow if progress well Type cross and transfuse 2 units of packed RBCs   Markon Jares J Laniece Hornbaker 04/21/2014, 7:39 AM

## 2014-04-21 NOTE — Plan of Care (Signed)
Problem: Consults Goal: Diagnosis- Total Joint Replacement Outcome: Completed/Met Date Met:  04/21/14 Primary Total Knee  Right & Left

## 2014-04-21 NOTE — Progress Notes (Signed)
Pt with critical lab values: Calcium 6.0 and Potassium >7.7. Ainsley Spinner, PA notified. Received verbal orders for STAT repeat CMet, 12-lead EKG. Additional verbals orders to change current IV fluids to NS @ 150mL/hr. Nursing will continue to monitor.

## 2014-04-21 NOTE — Progress Notes (Signed)
Orthopedic Tech Progress Note Patient Details:  Mary Bridges 1956-10-15 [id number] On cpm at 8:00 pm (B) LE 0-70 Patient ID: Nicolasa Ducking, female   DOB: 31-Aug-1956, 58 y.o.   MRN: [id number]   Braulio Bosch 04/21/2014, 7:56 PM

## 2014-04-21 NOTE — Evaluation (Signed)
Physical Therapy Evaluation Patient Details Name: Mary Bridges MRN: [id number] DOB: [birth year] Today's Date: 04/21/2014   History of Present Illness  58 y.o. female s/p bilateral TKA  Clinical Impression  Pt is s/p bilateral TKA resulting in the deficits listed below (see PT Problem List). Demonstrates good knee stability bilaterally with no instances of knee buckling during therapy session this AM. Pt will benefit from skilled PT to increase their independence and safety with mobility to allow discharge to the venue listed below.     Follow Up Recommendations Home health PT;Supervision/Assistance - 24 hour    Equipment Recommendations  None recommended by PT;Other (comment) (Consult OT for 3 in 1. unsure if it will fit in shower)    Recommendations for Other Services OT consult     Precautions / Restrictions Precautions Precautions: Knee Precaution Comments: Verbally reviewed precautions Required Braces or Orthoses: Knee Immobilizer - Right;Knee Immobilizer - Left Restrictions Weight Bearing Restrictions: Yes RLE Weight Bearing: Weight bearing as tolerated LLE Weight Bearing: Weight bearing as tolerated      Mobility  Bed Mobility Overal bed mobility: Modified Independent             General bed mobility comments: No physical assist need. Pt performs bed mobility safely.  Transfers Overall transfer level: Needs assistance Equipment used: Rolling walker (2 wheeled) Transfers: Sit to/from Stand Sit to Stand: Min assist         General transfer comment: min assist from lowest bed setting and from recliner for RW stability. Performed without knee immobilizer on LLE from recliner as she was unable to stand with assistance from recliner with bil knee immobilizers in place. Showed good knee stability without buckling. Cues for hand placement.  Ambulation/Gait Ambulation/Gait assistance: Min guard Ambulation Distance (Feet): 10 Feet Assistive device: Rolling walker  (2 wheeled) Gait Pattern/deviations: Step-through pattern;Decreased stride length;Antalgic Gait velocity: decreased   General Gait Details: Pt ambulated short distance with no evidence of knee buckling within knee immobilizers. Training for quad activation in stance phase with cues to extend knee.  Stairs            Wheelchair Mobility    Modified Rankin (Stroke Patients Only)       Balance Overall balance assessment: Modified Independent                                           Pertinent Vitals/Pain 1/10 pain Patient repositioned in chair for comfort.     Home Living Family/patient expects to be discharged to:: Private residence Living Arrangements: Spouse/significant other;Children Available Help at Discharge: Family;Available 24 hours/day Type of Home: House Home Access: Stairs to enter Entrance Stairs-Rails: None Entrance Stairs-Number of Steps: 2 Home Layout: Two level;Able to live on main level with bedroom/bathroom Home Equipment: [place] - 2 wheels;Bedside commode      Prior Function Level of Independence: Independent               Hand Dominance   Dominant Hand: Right    Extremity/Trunk Assessment               Lower Extremity Assessment: RLE deficits/detail;LLE deficits/detail RLE Deficits / Details: decreased strength and ROM LLE Deficits / Details: decreased strength and ROM     Communication   Communication: No difficulties  Cognition Arousal/Alertness: Awake/alert Behavior During Therapy: WFL for tasks assessed/performed Overall Cognitive Status: Within Functional Limits for  tasks assessed                      General Comments General comments (skin integrity, edema, etc.): Reviewed therapeutic exercises. minimal amount of blood coming through lateral edge of bandage on R knee. Discussed safe transfers with knee immobilizers in place.    Exercises Total Joint Exercises Ankle Circles/Pumps:  AROM;Both;10 reps;Supine Quad Sets: AROM;Both;10 reps;Supine      Assessment/Plan    PT Assessment Patient needs continued PT services  PT Diagnosis Difficulty walking;Abnormality of gait;Acute pain   PT Problem List Decreased strength;Decreased range of motion;Decreased activity tolerance;Decreased balance;Decreased mobility;Decreased knowledge of use of DME;Decreased safety awareness;Decreased knowledge of precautions;Pain  PT Treatment Interventions DME instruction;Gait training;Functional mobility training;Stair training;Therapeutic activities;Therapeutic exercise;Balance training;Neuromuscular re-education;Patient/family education;Modalities   PT Goals (Current goals can be found in the Care Plan section) Acute Rehab PT Goals Patient Stated Goal: Go home PT Goal Formulation: With patient Time For Goal Achievement: 04/28/14 Potential to Achieve Goals: Good    Frequency 7X/week   Barriers to discharge        Co-evaluation               End of Session Equipment Utilized During Treatment: Right knee immobilizer;Left knee immobilizer Activity Tolerance: Patient tolerated treatment well Patient left: in chair;with call bell/phone within reach Nurse Communication: Mobility status         Time: 7867-6720 PT Time Calculation (min): 24 min   Charges:   PT Evaluation $Initial PT Evaluation Tier I: 1 Procedure PT Treatments $Therapeutic Activity: 8-22 mins   PT G CodesCamille Bal [place], [place] 04/21/2014, 12:33 PM

## 2014-04-21 NOTE — Progress Notes (Signed)
CRITICAL VALUE ALERT  Critical value received:  Calcium 6.0       Potassium >7.7  Date of notification:  04/21/2014  Time of notification:  [phone]             0632  Critical value read back: yes  Nurse who received alert:  Wallie Char, RN  MD notified (1st page):  Ainsley Spinner, PA (on call)  Time of first page:  [phone]  MD notified (2nd page):  Time of second page:  Responding MD:  Ainsley Spinner, PA  Time MD responded:  [phone]

## 2014-04-21 NOTE — Evaluation (Signed)
Occupational Therapy Evaluation Patient Details Name: Mary Bridges MRN: [id number] DOB: [birth year] Today's Date: 04/21/2014    History of Present Illness 58 y.o. female s/p bilateral TKA   Clinical Impression   Pt admitted with the above diagnoses and presents with below problem list. Pt will benefit from continued acute OT to address the below listed deficits and maximize independence with basic ADLs. Education on techniques and AE for safe completion of ADLs. Pt c/o dizziness during in-room ambulation. Dizziness resolved after about 5 minutes of sitting.      Follow Up Recommendations  No OT follow up    Equipment Recommendations  Other (comment) (already has recommended DME; recommended reacher to pt)    Recommendations for Other Services       Precautions / Restrictions Precautions Precautions: Knee Precaution Comments: Verbally reviewed precautions Required Braces or Orthoses: Knee Immobilizer - Right (per PT ok to use right KI only) Knee Immobilizer - Right: On when out of bed or walking Restrictions Weight Bearing Restrictions: Yes RLE Weight Bearing: Weight bearing as tolerated LLE Weight Bearing: Weight bearing as tolerated      Mobility Bed Mobility Overal bed mobility: Modified Independent             General bed mobility comments: not assessed pt in chair upon arrival  Transfers Overall transfer level: Needs assistance Equipment used: Rolling walker (2 wheeled) Transfers: Sit to/from [employer] Transfers Sit to Stand: Min guard;From elevated surface Stand pivot transfers: Min guard;From elevated surface       General transfer comment: sit<>stands with min guard to/from chair and 3n1 over toilet.    Balance Overall balance assessment: Needs assistance Sitting-balance support: Feet supported;No upper extremity supported Sitting balance-Leahy Scale: Good     Standing balance support: Bilateral upper extremity supported;During functional  activity Standing balance-Leahy Scale: Poor                              ADL Overall ADL's : Needs assistance/impaired Eating/Feeding: Set up;Sitting   Grooming: Set up;Sitting;Standing   Upper Body Bathing: Set up;Sitting   Lower Body Bathing: Min guard;With adaptive equipment;Sit to/from stand   Upper Body Dressing : Set up;Sitting   Lower Body Dressing: Minimal assistance;Sit to/from stand   Toilet Transfer: Min guard;Ambulation;RW (3n1 over toilet)   Toileting- Clothing Manipulation and Hygiene: Min guard;Sit to/from stand   Tub/ Shower Transfer: Min guard;Ambulation;3 in 1;Rolling walker   Functional mobility during ADLs: Min guard;Rolling walker General ADL Comments: Education on techniques and AE for safe completion with ADLs. Education on safety with rw during functional mobility.      Vision                     Perception     Praxis      Pertinent Vitals/Pain 1/10 RLE at rest. 3/10 RLE during session. C/o dizziness OOB.      Hand Dominance Right   Extremity/Trunk Assessment Upper Extremity Assessment Upper Extremity Assessment: Overall WFL for tasks assessed   Lower Extremity Assessment Lower Extremity Assessment: Defer to PT evaluation RLE Deficits / Details: decreased strength and ROM RLE: Unable to fully assess due to immobilization LLE Deficits / Details: decreased strength and ROM LLE: Unable to fully assess due to immobilization       Communication Communication Communication: No difficulties   Cognition Arousal/Alertness: Awake/alert Behavior During Therapy: WFL for tasks assessed/performed Overall Cognitive Status: Within Functional Limits for  tasks assessed                     General Comments       Exercises Exercises: Total Joint     Shoulder Instructions      Home Living Family/patient expects to be discharged to:: Private residence Living Arrangements: Spouse/significant other;Children Available  Help at Discharge: Family;Available 24 hours/day Type of Home: House Home Access: Stairs to enter [employer] of Steps: 2 Entrance Stairs-Rails: None Home Layout: Two level;Able to live on main level with bedroom/bathroom Alternate Level Stairs-Number of Steps: 12 Alternate Level Stairs-Rails: Right;Left Bathroom Shower/Tub: Walk-in shower;Other (comment) (small; unable to fit rw and 3n1/shower seat)   Bathroom Toilet: Standard Bathroom Accessibility: Yes How Accessible: Accessible via walker Home Equipment: [place] - 2 wheels;Bedside commode;Crutches   Additional Comments: pt has ordered and received 3n1 and rw.      Prior Functioning/Environment Level of Independence: Independent             OT Diagnosis: Acute pain   OT Problem List: Pain;Decreased knowledge of use of DME or AE;Decreased knowledge of precautions;Impaired balance (sitting and/or standing)   OT Treatment/Interventions: Self-care/ADL training;Therapeutic exercise;DME and/or AE instruction;Therapeutic activities;Patient/family education    OT Goals(Current goals can be found in the care plan section) Acute Rehab OT Goals Patient Stated Goal: move without pain OT Goal Formulation: With patient Time For Goal Achievement: 04/28/14 Potential to Achieve Goals: Good ADL Goals Pt Will Perform Grooming: with supervision;standing Pt Will Perform Lower Body Bathing: with supervision;with adaptive equipment;sit to/from stand Pt Will Perform Lower Body Dressing: with supervision;with adaptive equipment;sit to/from stand Pt Will Perform Toileting - Clothing Manipulation and hygiene: with supervision;sit to/from stand Pt Will Perform Tub/Shower Transfer: with supervision;ambulating;3 in 1;rolling walker  OT Frequency: Min 3X/week   Barriers to D/C:            Co-evaluation              End of Session Equipment Utilized During Treatment: Gait belt;Rolling walker Nurse Communication: Other  (comment) (dizziness when OOB; small area of blood on RLE dressing )  Activity Tolerance: Patient tolerated treatment well Patient left: in chair;with call bell/phone within reach   Time: 1203-1224 OT Time Calculation (min): 21 min Charges:  OT General Charges $OT Visit: 1 Procedure OT Evaluation $Initial OT Evaluation Tier I: 1 Procedure OT Treatments $Self Care/Home Management : 8-22 mins G-Codes:    Hortencia Pilar 05/18/14, 12:58 PM

## 2014-04-21 NOTE — Discharge Instructions (Signed)

## 2014-04-22 LAB — CBC
HEMATOCRIT: 24.5 % — AB (ref 36.0–46.0)
HEMATOCRIT: 24.8 % — AB (ref 36.0–46.0)
HEMOGLOBIN: 8.1 g/dL — AB (ref 12.0–15.0)
HEMOGLOBIN: 8.2 g/dL — AB (ref 12.0–15.0)
MCH: 29.3 pg (ref 26.0–34.0)
MCH: 29.4 pg (ref 26.0–34.0)
MCHC: 33.1 g/dL (ref 30.0–36.0)
MCHC: 33.1 g/dL (ref 30.0–36.0)
MCV: 88.8 fL (ref 78.0–100.0)
MCV: 88.9 fL (ref 78.0–100.0)
Platelets: 63 10*3/uL — ABNORMAL LOW (ref 150–400)
Platelets: 180 10*3/uL (ref 150–400)
RBC: 2.76 MIL/uL — ABNORMAL LOW (ref 3.87–5.11)
RBC: 2.79 MIL/uL — ABNORMAL LOW (ref 3.87–5.11)
RDW: 13.5 % (ref 11.5–15.5)
RDW: 13.5 % (ref 11.5–15.5)
WBC: 10.5 10*3/uL (ref 4.0–10.5)
WBC: 13 10*3/uL — ABNORMAL HIGH (ref 4.0–10.5)

## 2014-04-22 LAB — BASIC METABOLIC PANEL
BUN: 20 mg/dL (ref 6–23)
CO2: 22 mEq/L (ref 19–32)
CREATININE: 0.73 mg/dL (ref 0.50–1.10)
Calcium: 8.5 mg/dL (ref 8.4–10.5)
Chloride: 101 mEq/L (ref 96–112)
GFR calc non Af Amer: >90 mL/min (ref >90)
GLUCOSE: 147 mg/dL — AB (ref 70–99)
POTASSIUM: 4.6 meq/L (ref 3.7–5.3)
Sodium: 137 mEq/L (ref 137–147)

## 2014-04-22 MED ORDER — RIVAROXABAN 10 MG PO TABS: ORAL_TABLET | ORAL | Status: DC

## 2014-04-22 MED ORDER — CELECOXIB 200 MG PO CAPS: ORAL_CAPSULE | ORAL | Status: DC

## 2014-04-22 MED ORDER — DSS 100 MG PO CAPS: ORAL_CAPSULE | ORAL | Status: DC

## 2014-04-22 MED ORDER — BISACODYL 5 MG PO TBEC: DELAYED_RELEASE_TABLET | ORAL | Status: DC

## 2014-04-22 MED ORDER — OXYCODONE HCL ER 20 MG PO T12A: 20.0000 mg | EXTENDED_RELEASE_TABLET | Freq: Two times a day (BID) | ORAL | Status: DC

## 2014-04-22 MED ORDER — OXYCODONE HCL 5 MG PO TABS: ORAL_TABLET | ORAL | Status: DC

## 2014-04-22 NOTE — Progress Notes (Signed)
Physical Therapy Treatment Patient Details Name: Mary Bridges MRN: [id number] DOB: 07-30-1956 Today's Date: 04/22/2014    History of Present Illness 58 y.o. female s/p bilateral TKA    PT Comments    Pt progressing well with mobility and exercises today. Pt at supervision level for mobility at this time. Educated on proper stair management technique and required min guard to steady RW for stair mobility. Pt given handout on proper stair management and HEP for TKA. Pt hopeful to D/C today. Will be safe from mobility standpoint for D/C with 24/7 (A).    Follow Up Recommendations  Home health PT;Supervision/Assistance - 24 hour     Equipment Recommendations  None recommended by PT;Other (comment)    Recommendations for Other Services OT consult     Precautions / Restrictions Precautions Precautions: Knee Precaution Comments: reviewed no pillow under knee precaution  Required Braces or Orthoses: Knee Immobilizer - Right Knee Immobilizer - Right: On when out of bed or walking Restrictions Weight Bearing Restrictions: Yes RLE Weight Bearing: Weight bearing as tolerated LLE Weight Bearing: Weight bearing as tolerated    Mobility  Bed Mobility Overal bed mobility: Modified Independent             General bed mobility comments: pt demo good technique with bed mobility; use of UEs to advance LEs   Transfers Overall transfer level: Needs assistance Equipment used: Rolling walker (2 wheeled) Transfers: Sit to/from Stand Sit to Stand: From elevated surface;Min guard;Supervision         General transfer comment: min guard from lower surfaced bed; at supervision level when transferring from [facility] secondary to use of armrests; cues for sequencing and hand placement   Ambulation/Gait Ambulation/Gait assistance: Supervision Ambulation Distance (Feet): 150 Feet Assistive device: Rolling walker (2 wheeled) Gait Pattern/deviations: Step-through pattern;Decreased stride  length;Trunk flexed;Wide base of support Gait velocity: decreased Gait velocity interpretation: Below normal speed for age/gender     Stairs Stairs: Yes Stairs assistance: Min guard Stair Management: No rails;Step to pattern;Forwards;With walker Number of Stairs: 2 General stair comments: multimodal cues for sequencing and proper technique; given handout for increased carry over; min guard to block RW and to steady for safety   Wheelchair Mobility    Modified Rankin (Stroke Patients Only)       Balance Overall balance assessment: Needs assistance Sitting-balance support: Feet supported;No upper extremity supported Sitting balance-Leahy Scale: Good     Standing balance support: During functional activity;Bilateral upper extremity supported;No upper extremity supported Standing balance-Leahy Scale: Fair Standing balance comment: porgressed to standing without UE support for ADLs in bathroom; no LOB noted; supervision for safety                     Cognition Arousal/Alertness: Awake/alert Behavior During Therapy: WFL for tasks assessed/performed Overall Cognitive Status: Within Functional Limits for tasks assessed                      Exercises Total Joint Exercises Ankle Circles/Pumps: AROM;Both;10 reps;Supine Quad Sets: AROM;Both;10 reps;Seated Heel Slides: AROM;Both;Supine;10 reps Long Arc Quad: AROM;Strengthening;Both;10 reps;Seated Goniometric ROM: Lt knee AROM 2 degrees to 60 degrees in sitting; Rt LE AROM 0 degrees to 65 degrees AROM in sitting  Other Exercises Other Exercises: pt given HEP handout      General Comments        Pertinent Vitals/Pain 2/10 in bil LEs with activity; 0/10 at rest     Home Living  Prior Function            PT Goals (current goals can now be found in the care plan section) Acute Rehab PT Goals Patient Stated Goal: move without pain PT Goal Formulation: With patient Time For Goal  Achievement: 04/28/14 Potential to Achieve Goals: Good Progress towards PT goals: Progressing toward goals    Frequency  7X/week    PT Plan Current plan remains appropriate    Co-evaluation             End of Session Equipment Utilized During Treatment: Right knee immobilizer;Gait belt Activity Tolerance: Patient tolerated treatment well Patient left: in chair;with call bell/phone within reach     Time: 0757-0822 PT Time Calculation (min): 25 min  Charges:  $Gait Training: 8-22 mins $Therapeutic Exercise: 8-22 mins                    G CodesKennis Carina [place], [place]  [phone] 04/22/2014, 9:17 AM

## 2014-04-22 NOTE — Progress Notes (Signed)
Physical Therapy Treatment Patient Details Name: Mary Bridges MRN: [id number] DOB: 08/30/1956 Today's Date: 04/22/2014    History of Present Illness 58 y.o. female s/p bilateral TKA    PT Comments    Pt seen for final session prior to D/c home. Pt and husband with questions regarding mobility; stair management and car transfers. See education provided below. Pt and husband appreciative of all questions answered. Pt safe and ready for D/C with 24/7 supervision from family. All goals adequate for D/C at this time.  Follow Up Recommendations  Home health PT;Supervision/Assistance - 24 hour     Equipment Recommendations  None recommended by PT;Other (comment)    Recommendations for Other Services OT consult     Precautions / Restrictions Precautions Precautions: Knee Precaution Comments: reviewed no pillow under knee precaution  Required Braces or Orthoses: Knee Immobilizer - Right Knee Immobilizer - Right: On when out of bed or walking Restrictions Weight Bearing Restrictions: Yes RLE Weight Bearing: Weight bearing as tolerated LLE Weight Bearing: Weight bearing as tolerated    Mobility  Bed Mobility Overal bed mobility: Modified Independent Bed Mobility: Sit to Supine       Sit to supine: Modified independent (Device/Increase time)   General bed mobility comments: able to scoot up in bed mod I; good BUE strength   Transfers Overall transfer level: Needs assistance Equipment used: Rolling walker (2 wheeled) Transfers: Sit to/from [employer] Sit to Stand: Min guard         General transfer comment: leans forward during sit<>stand, during stand>sit pt using both hands on walker, discussed bringing at least hand back to surface as soon as she felt she could.  Ambulation/Gait Ambulation/Gait assistance: Supervision Ambulation Distance (Feet): 150 Feet Assistive device: Rolling walker (2 wheeled) Gait Pattern/deviations: Step-through  pattern;Decreased stride length;Trunk flexed;Wide base of support Gait velocity: decreased Gait velocity interpretation: Below normal speed for age/gender     Stairs Stairs: Yes Stairs assistance: Min guard Stair Management: No rails;Step to pattern;Forwards;With walker Number of Stairs: 2 General stair comments: multimodal cues for sequencing and proper technique; given handout for increased carry over; min guard to block RW and to steady for safety   Wheelchair Mobility    Modified Rankin (Stroke Patients Only)       Balance Overall balance assessment: Needs assistance Sitting-balance support: Feet supported;No upper extremity supported Sitting balance-Leahy Scale: Good     Standing balance support: During functional activity;Bilateral upper extremity supported;No upper extremity supported Standing balance-Leahy Scale: Fair Standing balance comment: porgressed to standing without UE support for ADLs in bathroom; no LOB noted; supervision for safety                     Cognition Arousal/Alertness: Awake/alert Behavior During Therapy: WFL for tasks assessed/performed Overall Cognitive Status: Within Functional Limits for tasks assessed                      Exercises Total Joint Exercises Ankle Circles/Pumps: AROM;Both;10 reps;Supine Quad Sets: AROM;Both;10 reps;Seated Heel Slides: AROM;Both;Supine;10 reps Long Arc Quad: AROM;Strengthening;Both;10 reps;Seated Goniometric ROM: Lt knee AROM 2 degrees to 60 degrees in sitting; Rt LE AROM 0 degrees to 65 degrees AROM in sitting  Other Exercises Other Exercises: pt given HEP handout      General Comments General comments (skin integrity, edema, etc.): pt and husband seen for review of education; answered questions regarding stair management; reviewed handout of stair management with pt and husband; educated on proper technique  to transfer in/out of car; reviewed to have throw rugs put away and all dogs up for  safe mobilization in house. all questions answered and pt and husband appreciative       Pertinent Vitals/Pain No c/o pain     Home Living                      Prior Function            PT Goals (current goals can now be found in the care plan section) Acute Rehab PT Goals Patient Stated Goal: home today  PT Goal Formulation: With patient Time For Goal Achievement: 04/28/14 Potential to Achieve Goals: Good Progress towards PT goals: Goals met/education completed, patient discharged from PT    Frequency  7X/week    PT Plan Current plan remains appropriate    Co-evaluation             End of Session Equipment Utilized During Treatment: Right knee immobilizer;Gait belt Activity Tolerance: Patient tolerated treatment well Patient left: in bed;with call bell/phone within reach     Time: 1121-1131 PT Time Calculation (min): 10 min  Charges:  $Gait Training: 8-22 mins $Therapeutic Exercise: 8-22 mins $Self Care/Home Management: 08-05-2023                    G CodesKennis Carina [place], [place] 04/22/2014, 12:23 PM

## 2014-04-22 NOTE — Progress Notes (Signed)
Occupational Therapy Treatment Patient Details Name: Mary Bridges MRN: [id number] DOB: 1956/07/21 Today's Date: 04/22/2014    History of present illness 58 y.o. female s/p bilateral TKA   OT comments  Pt progressing well toward acute OT goals. Pt fatigued upon arrival requesting to get back in bed from chair. Pt completed sit<>stand,ambulated <5 steps, and  EOB>supine min guard. Reviewed and demonstrated to pt shower transfer technique with pt indicating understanding and no concern with completing transfer. D/c plan remains appropriate from OT standpoint.  Follow Up Recommendations  No OT follow up    Equipment Recommendations  Other (comment)    Recommendations for Other Services      Precautions / Restrictions Precautions Precautions: Knee Precaution Comments: reviewed no pillow under knee precaution  Required Braces or Orthoses: Knee Immobilizer - Right Knee Immobilizer - Right: On when out of bed or walking Restrictions Weight Bearing Restrictions: Yes RLE Weight Bearing: Weight bearing as tolerated LLE Weight Bearing: Weight bearing as tolerated       Mobility Bed Mobility Overal bed mobility: Modified Independent Bed Mobility: Sit to Supine       Sit to supine: Modified independent (Device/Increase time)   General bed mobility comments: able to scoot up in bed mod I; good BUE strength   Transfers Overall transfer level: Needs assistance Equipment used: Rolling walker (2 wheeled) Transfers: Sit to/from [facility] Sit to Stand: Min guard         General transfer comment: leans forward during sit<>stand, during stand>sit pt using both hands on walker, discussed bringing at least hand back to surface as soon as she felt she could.    Balance Overall balance assessment: Needs assistance Sitting-balance support: Feet supported;No upper extremity supported Sitting balance-Leahy Scale: Good     Standing balance support: During functional  activity;Bilateral upper extremity supported;No upper extremity supported Standing balance-Leahy Scale: Fair Standing balance comment: porgressed to standing without UE support for ADLs in bathroom; no LOB noted; supervision for safety                    ADL                                       Functional mobility during ADLs: Min guard;Rolling walker General ADL Comments: reviewed ADL techniques, practiced sit<>stands, stand pivot, bed mobility      Vision                     Perception     Praxis      Cognition   Behavior During Therapy: WFL for tasks assessed/performed Overall Cognitive Status: Within Functional Limits for tasks assessed                       Extremity/Trunk Assessment               Exercises Total Joint Exercises Ankle Circles/Pumps: AROM;Both;10 reps;Supine Quad Sets: AROM;Both;10 reps;Seated Heel Slides: AROM;Both;Supine;10 reps Long Arc Quad: AROM;Strengthening;Both;10 reps;Seated Goniometric ROM: Lt knee AROM 2 degrees to 60 degrees in sitting; Rt LE AROM 0 degrees to 65 degrees AROM in sitting  Other Exercises Other Exercises: pt given HEP handout     Shoulder Instructions       General Comments      Pertinent Vitals/ Pain       1-2/10  Home Living  Prior Functioning/Environment              Frequency Min 3X/week     Progress Toward Goals  OT Goals(current goals can now be found in the care plan section)  Progress towards OT goals: Progressing toward goals  Acute Rehab OT Goals Patient Stated Goal: move without pain OT Goal Formulation: With patient Time For Goal Achievement: 04/28/14 Potential to Achieve Goals: Good ADL Goals Pt Will Perform Grooming: with supervision;standing Pt Will Perform Lower Body Bathing: with supervision;with adaptive equipment;sit to/from stand Pt Will Perform Lower Body Dressing: with  supervision;with adaptive equipment;sit to/from stand Pt Will Perform Toileting - Clothing Manipulation and hygiene: with supervision;sit to/from stand Pt Will Perform Tub/Shower Transfer: with supervision;ambulating;3 in 1;rolling walker  Plan Discharge plan remains appropriate    Co-evaluation                 End of Session Equipment Utilized During Treatment: Gait belt;Rolling walker   Activity Tolerance Patient tolerated treatment well;Patient limited by fatigue   Patient Left in bed;with call bell/phone within reach   Nurse Communication          Time: 1030-1044 OT Time Calculation (min): 14 min  Charges: OT General Charges $OT Visit: 1 Procedure OT Treatments $Self Care/Home Management : 8-22 mins  Hortencia Pilar 04/22/2014, 10:56 AM

## 2014-04-22 NOTE — Discharge Summary (Signed)
Patient ID: Mary Bridges MRN: [id number] DOB/AGE: Dec 11, 1956 58 y.o.  Admit date: 04/20/2014 Discharge date: 04/22/2014  Admission Diagnoses:  Principal Problem:   DJD (degenerative joint disease) of knee Active Problems:   hx: breast cancer, left DCIS, right IDC, receptor + her2 -   Breast cancer, Right   Pain in limb   Leg DVT (deep venous thromboembolism), chronic   History of blood clots   DJD (degenerative joint disease), lumbosacral   Hyperkalemia   Postoperative anemia due to acute blood loss   Hypocalcemia   Discharge Diagnoses:  Same  Past Medical History  Diagnosis Date  . History of blood clots 05/1992    Left leg  . Arthritis     Knees  . DJD (degenerative joint disease) of knee   . DJD (degenerative joint disease), lumbosacral   . Peripheral vascular disease     dvt 93  . Depression   . Bladder infection 82    kidney infection during preg  . Breast cancer, Right 04/21/2009    rt,lft    Surgeries: Procedure(s): BILATERAL TOTAL KNEE ARTHROPLASTY  on 04/20/2014   Consultants:    Discharged Condition: Improved  Hospital Course: SUNJAI LEVANDOSKI is an 58 y.o. female who was admitted 04/20/2014 for operative treatment ofDJD (degenerative joint disease) of knee. Patient has severe unremitting pain that affects sleep, daily activities, and work/hobbies. After pre-op clearance the patient was taken to the operating room on 04/20/2014 and underwent  Procedure(s): BILATERAL TOTAL KNEE ARTHROPLASTY .    Patient was given perioperative antibiotics: Anti-infectives   Start     Dose/Rate Route Frequency Ordered Stop   04/20/14 2100  vancomycin (VANCOCIN) IVPB 1000 mg/200 mL premix     1,000 mg 200 mL/hr over 60 Minutes Intravenous Every 12 hours 04/20/14 1713 04/20/14 2222   04/20/14 0945  vancomycin (VANCOCIN) IVPB 1000 mg/200 mL premix  Status:  Discontinued     1,000 mg 200 mL/hr over 60 Minutes Intravenous To Surgery 04/20/14 0935 04/20/14 1609        Patient was given sequential compression devices, early ambulation, and chemoprophylaxis to prevent DVT.  Patient benefited maximally from hospital stay and there were no complications.    Recent vital signs: Patient Vitals for the past 24 hrs:  BP Temp Temp src Pulse Resp SpO2  04/22/14 0505 130/78 mmHg 98 F (36.7 C) - 75 18 97 %  04/22/14 0400 - - - - 18 98 %  04/22/14 0000 - - - - 18 98 %  04/21/14 2101 105/57 mmHg 98.3 F (36.8 C) - 81 18 98 %  04/21/14 2000 - - - - 18 98 %  04/21/14 1308 113/64 mmHg 98.1 F (36.7 C) Oral 83 18 100 %     Recent laboratory studies:  Recent Labs  04/21/14 0800 04/21/14 0850 04/22/14 0435  WBC  --  13.4* 10.5  HGB  --  9.4* 8.1*  HCT  --  28.2* 24.5*  PLT  --  216 63*  NA 135*  --  137  K 4.6  --  4.6  CL 98  --  101  CO2 23  --  22  BUN 17  --  20  CREATININE 0.77  --  0.73  GLUCOSE 157*  --  147*  CALCIUM 8.5  --  8.5     Discharge Medications:     Medication List    STOP taking these medications       diclofenac 75 MG EC  tablet  Commonly known as:  VOLTAREN     HYDROcodone-acetaminophen 5-325 MG per tablet  Commonly known as:  NORCO/VICODIN     Omega-3 Fish Oil 1200 MG Caps     OSTEO BI-FLEX REGULAR STRENGTH PO      TAKE these medications       ALLEGRA ALLERGY 180 MG tablet  Generic drug:  fexofenadine  Take 180 mg by mouth daily.     anastrozole 1 MG tablet  Commonly known as:  ARIMIDEX  Take 1 tablet (1 mg total) by mouth daily.     b complex vitamins tablet  Take 1 tablet by mouth daily.     bisacodyl 5 MG EC tablet  Commonly known as:  DULCOLAX  Take 2 tablets every night with dinner until bowel movement.  LAXITIVE.  Restart if two days since last bowel movement     CALCIUM + D PO  Take 1 tablet by mouth daily.     celecoxib 200 MG capsule  Commonly known as:  CELEBREX  1 tab po q day with food for pain and  swelling     cholecalciferol 1000 UNITS tablet  Commonly known as:  VITAMIN D  Take  1,000 Units by mouth daily.     citalopram 20 MG tablet  Commonly known as:  CELEXA  Take 10 mg by mouth daily.     DSS 100 MG Caps  1 tab 2 times a day while on narcotics.  STOOL SOFTENER     oxyCODONE 5 MG immediate release tablet  Commonly known as:  Oxy IR/ROXICODONE  1-2 tablets every 4-6 hrs as needed for pain     OxyCODONE 20 mg T12a 12 hr tablet  Commonly known as:  OXYCONTIN  Take 1 tablet (20 mg total) by mouth every 12 (twelve) hours.     rivaroxaban 10 MG Tabs tablet  Commonly known as:  XARELTO  1 tab a day for the next 30 days to prevent blood clots     rOPINIRole 0.25 MG tablet  Commonly known as:  REQUIP  Take 1 mg by mouth at bedtime.        Diagnostic Studies: No results found.  Disposition: 01-Home or Self Care      Discharge Orders   Future Appointments Provider Department Dept Phone   12/28/2014 10:30 AM [facility] Only [facility] [phone]   01/04/2015 11:30 AM Chauncey Cruel, MD [facility] [phone]   Future Orders Complete By Expires   Call MD / Call 911  As directed    Change dressing  As directed    Constipation Prevention  As directed    CPM  As directed    Diet - low sodium heart healthy  As directed    Discharge instructions  As directed    Do not put a pillow under the knee. Place it under the heel.  As directed    Increase activity slowly as tolerated  As directed    TED hose  As directed       Follow-up Information   Follow up with Lorn Junes, MD On 05/05/2014. (appt time time 10:15 am)    Specialty:  Orthopedic Surgery   Contact information:   [place] [address] [phone]        Signed: Linda Hedges 04/22/2014, 9:14 AM

## 2014-04-23 ENCOUNTER — Encounter (HOSPITAL_COMMUNITY): Payer: Self-pay | Admitting: Orthopedic Surgery

## 2014-04-23 LAB — TYPE AND SCREEN
ABO/RH(D): A POS
Antibody Screen: NEGATIVE
UNIT DIVISION: 0
Unit division: 0

## 2014-06-02 ENCOUNTER — Other Ambulatory Visit: Payer: Self-pay | Admitting: *Deleted

## 2014-06-02 ENCOUNTER — Telehealth: Payer: Self-pay | Admitting: *Deleted

## 2014-06-02 NOTE — Telephone Encounter (Signed)
Patient's retrieval of IVC filter has been scheduled for 07-31-14 in the [facility] lab. Dr. Noemi Chapel stopped her Xarelto on 05-23-14 per patient.  Kirsten at Dr. Archie Endo has been notified;  Dr. Noemi Chapel requested retrieval be done after 3 months from patient's bilateral TKA.

## 2014-06-09 ENCOUNTER — Other Ambulatory Visit: Payer: Self-pay | Admitting: Obstetrics and Gynecology

## 2014-06-10 LAB — CYTOLOGY - PAP

## 2014-07-24 ENCOUNTER — Encounter (HOSPITAL_COMMUNITY): Payer: Self-pay | Admitting: Pharmacy Technician

## 2014-07-29 ENCOUNTER — Other Ambulatory Visit: Payer: Self-pay

## 2014-07-31 ENCOUNTER — Encounter (HOSPITAL_COMMUNITY)
Admission: RE | Disposition: A | Discharge status: Home / Self Care | Payer: Self-pay | Source: Ambulatory Visit | Attending: Vascular Surgery

## 2014-07-31 ENCOUNTER — Ambulatory Visit (HOSPITAL_COMMUNITY)
Admission: RE | Admit: 2014-07-31 | Discharge: 2014-07-31 | Disposition: A | Discharge status: Home / Self Care | Payer: 59 | Source: Ambulatory Visit | Attending: Vascular Surgery | Admitting: Vascular Surgery

## 2014-07-31 DIAGNOSIS — I739 Peripheral vascular disease, unspecified: Secondary | ICD-10-CM | POA: Diagnosis not present

## 2014-07-31 DIAGNOSIS — Z86718 Personal history of other venous thrombosis and embolism: Secondary | ICD-10-CM | POA: Insufficient documentation

## 2014-07-31 DIAGNOSIS — Z1889 Other specified retained foreign body fragments: Secondary | ICD-10-CM

## 2014-07-31 DIAGNOSIS — M1710 Unilateral primary osteoarthritis, unspecified knee: Secondary | ICD-10-CM | POA: Diagnosis not present

## 2014-07-31 DIAGNOSIS — M47817 Spondylosis without myelopathy or radiculopathy, lumbosacral region: Secondary | ICD-10-CM | POA: Diagnosis not present

## 2014-07-31 DIAGNOSIS — F329 Major depressive disorder, single episode, unspecified: Secondary | ICD-10-CM | POA: Diagnosis not present

## 2014-07-31 DIAGNOSIS — F3289 Other specified depressive episodes: Secondary | ICD-10-CM | POA: Insufficient documentation

## 2014-07-31 DIAGNOSIS — Z4689 Encounter for fitting and adjustment of other specified devices: Secondary | ICD-10-CM | POA: Insufficient documentation

## 2014-07-31 DIAGNOSIS — Z853 Personal history of malignant neoplasm of breast: Secondary | ICD-10-CM | POA: Insufficient documentation

## 2014-07-31 HISTORY — PX: PERIPHERAL VASCULAR CATHETERIZATION: SHX172C

## 2014-07-31 LAB — POCT I-STAT, CHEM 8
BUN: 15 mg/dL (ref 6–23)
CALCIUM ION: 1.2 mmol/L (ref 1.12–1.23)
CHLORIDE: 104 meq/L (ref 96–112)
Creatinine, Ser: 0.7 mg/dL (ref 0.50–1.10)
Glucose, Bld: 115 mg/dL — ABNORMAL HIGH (ref 70–99)
HEMATOCRIT: 39 % (ref 36.0–46.0)
Hemoglobin: 13.3 g/dL (ref 12.0–15.0)
POTASSIUM: 3.8 meq/L (ref 3.7–5.3)
SODIUM: 140 meq/L (ref 137–147)
TCO2: 24 mmol/L (ref 0–100)

## 2014-07-31 LAB — PROTIME-INR
INR: 0.98 (ref 0.00–1.49)
Prothrombin Time: 13 seconds (ref 11.6–15.2)

## 2014-07-31 SURGERY — IVC/SVC VENOGRAPHY

## 2014-07-31 MED ORDER — LABETALOL HCL 5 MG/ML IV SOLN: 10.0000 mg | INTRAVENOUS | Status: DC | PRN

## 2014-07-31 MED ORDER — MIDAZOLAM HCL 2 MG/2ML IJ SOLN
INTRAMUSCULAR | Status: AC
  Filled 2014-07-31: qty 2

## 2014-07-31 MED ORDER — HYDRALAZINE HCL 20 MG/ML IJ SOLN: 10.0000 mg | INTRAMUSCULAR | Status: DC | PRN

## 2014-07-31 MED ORDER — DEXTROSE-NACL 5-0.45 % IV SOLN
INTRAVENOUS | Status: DC
  Administered 2014-07-31: 12:00:00 via INTRAVENOUS

## 2014-07-31 MED ORDER — FENTANYL CITRATE 0.05 MG/ML IJ SOLN
INTRAMUSCULAR | Status: AC
  Filled 2014-07-31: qty 2

## 2014-07-31 MED ORDER — SODIUM CHLORIDE 0.9 % IV SOLN
INTRAVENOUS | Status: DC
  Administered 2014-07-31: 06:00:00 via INTRAVENOUS

## 2014-07-31 MED ORDER — LIDOCAINE HCL (PF) 1 % IJ SOLN
INTRAMUSCULAR | Status: AC
  Filled 2014-07-31: qty 30

## 2014-07-31 MED ORDER — ONDANSETRON HCL 4 MG/2ML IJ SOLN: 4.0000 mg | Freq: Four times a day (QID) | INTRAMUSCULAR | Status: DC | PRN

## 2014-07-31 MED ORDER — MORPHINE SULFATE 10 MG/ML IJ SOLN: 2.0000 mg | INTRAMUSCULAR | Status: DC | PRN

## 2014-07-31 MED ORDER — GUAIFENESIN-DM 100-10 MG/5ML PO SYRP: 15.0000 mL | ORAL_SOLUTION | ORAL | Status: DC | PRN

## 2014-07-31 MED ORDER — PHENOL 1.4 % MT LIQD: 1.0000 | OROMUCOSAL | Status: DC | PRN

## 2014-07-31 MED ORDER — HEPARIN (PORCINE) IN NACL 2-0.9 UNIT/ML-% IJ SOLN
INTRAMUSCULAR | Status: AC
  Filled 2014-07-31: qty 500

## 2014-07-31 MED ORDER — ACETAMINOPHEN 325 MG PO TABS: 325.0000 mg | ORAL_TABLET | ORAL | Status: DC | PRN

## 2014-07-31 MED ORDER — ACETAMINOPHEN 325 MG RE SUPP: 325.0000 mg | RECTAL | Status: DC | PRN

## 2014-07-31 NOTE — Progress Notes (Signed)
Site area:right groin  Site Prior to Removal:  Level 0 Pressure Applied For 10 MINUTES, sheath removed by AMitchell    Minutes Beginning at 1200 Manual:  yes Patient Status During Pull: stable Post Pull Groin Site:  Level 0 Post Pull Instructions Given: yes Post Pull Pulses Present: yes Dressing Applied: yes Bedrest Begins:1210  Comments:   No complications Site area: rt IJ Site Prior to Removal:  Level 0 Pressure Applied For: 15 Manual:   yes Patient Status During Pull:  stable Post Pull Site:  Level 0 Post Pull Instructions Given:  yes Post Pull Pulses Present: na Dressing Applied:  yes Bedrest begins @ 2751 Comments: no complications

## 2014-07-31 NOTE — Discharge Instructions (Signed)
Angiogram, Care After °Refer to this sheet in the next few weeks. These instructions provide you with information on caring for yourself after your procedure. Your health care provider may also give you more specific instructions. Your treatment has been planned according to current medical practices, but problems sometimes occur. Call your health care provider if you have any problems or questions after your procedure.  °WHAT TO EXPECT AFTER THE PROCEDURE °After your procedure, it is typical to have the following sensations: °· Minor discomfort or tenderness and a small bump at the catheter insertion site. The bump should usually decrease in size and tenderness within 1 to 2 weeks. °· Any bruising will usually fade within 2 to 4 weeks. °HOME CARE INSTRUCTIONS  °· You may need to keep taking blood thinners if they were prescribed for you. Take medicines only as directed by your health care provider. °· Do not apply powder or lotion to the site. °· Do not take baths, swim, or use a hot tub until your health care provider approves. °· You may shower 24 hours after the procedure. Remove the bandage (dressing) and gently wash the site with plain soap and water. Gently pat the site dry. °· Inspect the site at least twice daily. °· Limit your activity for the first 48 hours. Do not bend, squat, or lift anything over 20 lb (9 kg) or as directed by your health care provider. °· Plan to have someone take you home after the procedure. Follow instructions about when you can drive or return to work. °SEEK MEDICAL CARE IF: °· You get light-headed when standing up. °· You have drainage (other than a small amount of blood on the dressing). °· You have chills. °· You have a fever. °· You have redness, warmth, swelling, or pain at the insertion site. °SEEK IMMEDIATE MEDICAL CARE IF:  °· You develop chest pain or shortness of breath, feel faint, or pass out. °· You have bleeding, swelling larger than a walnut, or drainage from the  catheter insertion site. °· You develop pain, discoloration, coldness, or severe bruising in the leg or arm that held the catheter. °· You have heavy bleeding from the site. If this happens, hold pressure on the site and call 911. °MAKE SURE YOU: °· Understand these instructions. °· Will watch your condition. °· Will get help right away if you are not doing well or get worse. °Document Released: 06/15/2005 Document Revised: 04/13/2014 Document Reviewed: 04/21/2013 °ExitCare® Patient Information ©2015 [employer]. This information is not intended to replace advice given to you by your health care provider. Make sure you discuss any questions you have with your health care provider. ° °

## 2014-07-31 NOTE — Progress Notes (Signed)
0.9NS d/c'ed. D5.45NS started at 100cc/hr

## 2014-07-31 NOTE — H&P (Signed)
  [facility] SHORT STAY H&P  CC:  IVC filter removal  HPI: Pt with IVC filter placed for orthopedic procedure.  She is now ready for filter removal  Past Medical History  Diagnosis Date  . History of blood clots 05/1992    Left leg  . Arthritis     Knees  . DJD (degenerative joint disease) of knee   . DJD (degenerative joint disease), lumbosacral   . Peripheral vascular disease     dvt 93  . Depression   . Bladder infection 82    kidney infection during preg  . Breast cancer, Right 04/21/2009    rt,lft    FH:  Non-Contributory  Social HX History  Substance Use Topics  . Smoking status: Never Smoker   . Smokeless tobacco: Never Used  . Alcohol Use: No    Allergies Allergies  Allergen Reactions  . Penicillins Hives  . Codeine Rash  . Erythromycin Rash    Medications Current Facility-Administered Medications  Medication Dose Route Frequency Provider Last Rate Last Dose  . 0.9 %  sodium chloride infusion   Intravenous Continuous Elam Dutch, MD 100 mL/hr at 07/31/14 0610       PHYSICAL EXAM  Filed Vitals:   07/31/14 0547  BP: 162/98  Pulse: 63  Temp: 98.3 F (36.8 C)  Resp: 18    General:  WDWN in NAD HENT: WNL Eyes: Pupils equal Pulmonary: normal non-labored breathing , without Rales, rhonchi,  wheezing Cardiac: RRR, Extremities without ischemic changes, no Gangrene , no cellulitis; no open wounds;  Neuro A&O x 3; good sensation; motion in all extremities  Impression: Needs filter removal  Plan: IvC filter removal today Deirdra Heumann E @TODAY @ 7:51 AM

## 2014-07-31 NOTE — Op Note (Addendum)
Procedure: Attempted removal of inferior vena cava filter  Preoperative diagnosis: Retained inferior vena cava filter  Postoperative diagnosis: Same  Anesthesia: Local with IV sedation  Co Surgeon: Annamarie Major MD  Indications: Patient is a 58 year old female who had an IVC filter placed approximately 3 months ago. Was placed prior to orthopedic procedure. She now returns for elective removal of the filter.  Operative findings: #1 patent inferior vena cava filter #2 tip of filter adherent to the inferior vena cava wall unable to snare  Operative details: After obtaining informed consent, the patient was taken to the [facility] lab. The patient was placed in supine position on the Angio table. The patient's right groin was prepped and draped in usual sterile fashion. Local anesthesia was infiltrated over the right common femoral vein. Ultrasound was used to identify the right common femoral vein. This had normal compressibility and respiratory variation. An introducer needle was used to cannulate the right common femoral vein and 035 versacore wire threaded up into the inferior vena cava under fluoroscopic guidance. A 5 French sheath was placed over the guidewire into the right common femoral vein. The 5 French sheath was thoroughly flushed with heparinized saline. A 5 French pigtail catheter was then placed over the guidewire and advanced up to the level of the inferior vena cava filter. An inferior venacavogram was performed. This showed a patent IVC filter with no evidence of clot. The 5 French sheath was left in place for the pigtail catheter was removed over a guidewire. At this point attention was then turned to the right neck. This had been previously prepped and draped in sterile fashion. Ultrasound was used to identify the right internal jugular vein. Initially was thought to be an external jugular vein was cannulated and the guidewire would not advance through this. Therefore tabs were aborted in  this vein. Ultrasound was again used to identify the right jugular vein was successfully cannulated and is after several attempts. An 60 versacore wire was then threaded through the right internal jugular vein and down into the inferior vena cava under fluoroscopic guidance. A long 6 French sheath was advanced over the guidewire down to the top of the inferior vena cava filter. Several attempts were made using a 26 and a 15 snare in an attempt to grab the top of the filter. However none of these were successful. Venacavogram was repeated from the sheath from the jugular vein approach and this showed that the tip of the filter was adherent to the wall of the inferior vena cava. Additional attempts were made with an end snare device and these were also unsuccessful. At this point I discussed the case with my partner Dr. Trula Slade. He scrubbed in at this point and we attempted to remove the filter coming from a jugular vein and femoral vein approach simultaneously. The 5 French sheath and a right femoral vein was upsized to a 12 [place] sheath over the guidewire. A 10 x 4 mm angioplasty balloon was then used to try to pry the filter off of the wall of the inferior vena cava. I was able to move the tip of the filter slightly. However several attempts to snare the filter again were unsuccessful. I then attempted to use a 16 mm balloon to pry the filter off the wall and after several attempts this was also unsuccessful. Several more attempts were made to snare the filter from above and again you to the filter adherent to the wall we were unable to snag this.  At this point the patient had been on the table for several hours and I did not feel that we were making any progress toward removing the filter. Therefore attempts to remove the filter were abandoned. The 6 French sheath in the jugular vein and 12 French sheath in the right femoral vein left in place the hole in the holding area. The patient tolerated procedure well  and there were no complications. The patient was taken to the holding area in stable condition.  I discussed with the patient the possibility of returning in a few weeks to to remove the filter under general anesthesia or consideration could be given for bleeding the filter in place permanently. She will call in the next few days with her decision on which way she would like to proceed.  Ruta Hinds, MD [facility] Office: [phone] Pager: [phone]

## 2014-08-11 ENCOUNTER — Other Ambulatory Visit: Payer: Self-pay | Admitting: *Deleted

## 2014-08-20 ENCOUNTER — Other Ambulatory Visit (HOSPITAL_COMMUNITY): Payer: 59

## 2014-08-28 ENCOUNTER — Encounter (HOSPITAL_COMMUNITY): Payer: Self-pay | Admitting: Pharmacy Technician

## 2014-08-31 NOTE — Pre-Procedure Instructions (Signed)
Mary Bridges  08/31/2014   Your procedure is scheduled on:  Sept 29th @ 0730  Report to [facility] Admitting at 0530 AM.  Call this number if you have problems the morning of surgery: [phone]   Remember:   Do not eat food or drink liquids after midnight.   Take these medicines the morning of surgery with A SIP OF WATER: (Celexa) Citalopram, (allergra) fexofenadine, (arimidex), anastrozole.   Stop taking aspirin, Aleve, Ibuprofen, BC's, Goody's, Herbal medications, and Fish Oil   Do not wear jewelry, make-up or nail polish.  Do not wear lotions, powders, or perfumes. You may wear deodorant.  Do not shave 48 hours prior to surgery. Men may shave face and neck.  Do not bring valuables to the hospital.  [facility] is not responsible                  for any belongings or valuables.               Contacts, dentures or bridgework may not be worn into surgery.  Leave suitcase in the car. After surgery it may be brought to your room.  For patients admitted to the hospital, discharge time is determined by your                treatment team.               Patients discharged the day of surgery will not be allowed to drive  home.    Special Instructions: [name] - Preparing for Surgery  Before surgery, you can play an important role.  Because skin is not sterile, your skin needs to be as free of germs as possible.  You can reduce the number of germs on you skin by washing with CHG (chlorahexidine gluconate) soap before surgery.  CHG is an antiseptic cleaner which kills germs and bonds with the skin to continue killing germs even after washing.  Please DO NOT use if you have an allergy to CHG or antibacterial soaps.  If your skin becomes reddened/irritated stop using the CHG and inform your nurse when you arrive at Short Stay.  Do not shave (including legs and underarms) for at least 48 hours prior to the first CHG shower.  You may shave your face.  Please follow these  instructions carefully:   1.  Shower with CHG Soap the night before surgery and the                                morning of Surgery.  2.  If you choose to wash your hair, wash your hair first as usual with your       normal shampoo.  3.  After you shampoo, rinse your hair and body thoroughly to remove the                      Shampoo.  4.  Use CHG as you would any other liquid soap.  You can apply chg directly       to the skin and wash gently with scrungie or a clean washcloth.  5.  Apply the CHG Soap to your body ONLY FROM THE NECK DOWN.        Do not use on open wounds or open sores.  Avoid contact with your eyes,       ears, mouth and genitals (private parts).  Wash genitals (private parts)       with your normal soap.  6.  Wash thoroughly, paying special attention to the area where your surgery        will be performed.  7.  Thoroughly rinse your body with warm water from the neck down.  8.  DO NOT shower/wash with your normal soap after using and rinsing off       the CHG Soap.  9.  Pat yourself dry with a clean towel.            10.  Wear clean pajamas.            11.  Place clean sheets on your bed the night of your first shower and do not        sleep with pets.  Day of Surgery  Do not apply any lotions/deoderants the morning of surgery.  Please wear clean clothes to the hospital/surgery center.      Please read over the following fact sheets that you were given: Pain Booklet, Coughing and Deep Breathing, MRSA Information and Surgical Site Infection Prevention

## 2014-09-01 ENCOUNTER — Encounter (HOSPITAL_COMMUNITY)
Admission: RE | Admit: 2014-09-01 | Discharge: 2014-09-01 | Disposition: A | Discharge status: Home / Self Care | Payer: 59 | Source: Ambulatory Visit | Attending: Vascular Surgery | Admitting: Vascular Surgery

## 2014-09-01 ENCOUNTER — Encounter (HOSPITAL_COMMUNITY): Payer: Self-pay

## 2014-09-01 DIAGNOSIS — Z01812 Encounter for preprocedural laboratory examination: Secondary | ICD-10-CM | POA: Insufficient documentation

## 2014-09-01 DIAGNOSIS — Z01811 Encounter for preprocedural respiratory examination: Secondary | ICD-10-CM | POA: Diagnosis present

## 2014-09-01 DIAGNOSIS — Z4689 Encounter for fitting and adjustment of other specified devices: Secondary | ICD-10-CM | POA: Diagnosis not present

## 2014-09-01 HISTORY — DX: Disease of blood and blood-forming organs, unspecified: D75.9

## 2014-09-01 LAB — URINALYSIS, ROUTINE W REFLEX MICROSCOPIC
BILIRUBIN URINE: NEGATIVE
Glucose, UA: NEGATIVE mg/dL
HGB URINE DIPSTICK: NEGATIVE
Ketones, ur: NEGATIVE mg/dL
Leukocytes, UA: NEGATIVE
Nitrite: NEGATIVE
PH: 6 (ref 5.0–8.0)
Protein, ur: NEGATIVE mg/dL
SPECIFIC GRAVITY, URINE: 1.014 (ref 1.005–1.030)
Urobilinogen, UA: 0.2 mg/dL (ref 0.0–1.0)

## 2014-09-01 LAB — SURGICAL PCR SCREEN
MRSA, PCR: NEGATIVE
STAPHYLOCOCCUS AUREUS: POSITIVE — AB

## 2014-09-01 LAB — PROTIME-INR
INR: 0.97 (ref 0.00–1.49)
Prothrombin Time: 12.9 seconds (ref 11.6–15.2)

## 2014-09-01 LAB — APTT: aPTT: 27 seconds (ref 24–37)

## 2014-09-01 NOTE — Progress Notes (Signed)
Mupirocin Ointment Rx called into [facility] [address] for positive PCR of staph. Pt notified and voiced understanding.

## 2014-09-07 MED ORDER — VANCOMYCIN HCL 10 G IV SOLR
1500.0000 mg | INTRAVENOUS | Status: AC
  Administered 2014-09-08: 1500 mg via INTRAVENOUS
  Filled 2014-09-07: qty 1500

## 2014-09-08 ENCOUNTER — Encounter (HOSPITAL_COMMUNITY): Payer: Self-pay | Admitting: *Deleted

## 2014-09-08 ENCOUNTER — Encounter (HOSPITAL_COMMUNITY)
Admission: RE | Disposition: A | Discharge status: Home / Self Care | Payer: Self-pay | Source: Ambulatory Visit | Attending: Vascular Surgery

## 2014-09-08 ENCOUNTER — Other Ambulatory Visit: Payer: Self-pay | Admitting: *Deleted

## 2014-09-08 ENCOUNTER — Ambulatory Visit (HOSPITAL_COMMUNITY)
Admission: RE | Admit: 2014-09-08 | Discharge: 2014-09-08 | Disposition: A | Discharge status: Home / Self Care | Payer: 59 | Source: Ambulatory Visit | Attending: Vascular Surgery | Admitting: Vascular Surgery

## 2014-09-08 ENCOUNTER — Encounter (HOSPITAL_COMMUNITY): Payer: 59 | Admitting: Anesthesiology

## 2014-09-08 ENCOUNTER — Ambulatory Visit (HOSPITAL_COMMUNITY): Payer: 59 | Admitting: Anesthesiology

## 2014-09-08 DIAGNOSIS — T82525D Displacement of umbrella device, subsequent encounter: Secondary | ICD-10-CM

## 2014-09-08 DIAGNOSIS — Z452 Encounter for adjustment and management of vascular access device: Secondary | ICD-10-CM | POA: Diagnosis present

## 2014-09-08 DIAGNOSIS — E875 Hyperkalemia: Secondary | ICD-10-CM

## 2014-09-08 DIAGNOSIS — Z1889 Other specified retained foreign body fragments: Secondary | ICD-10-CM

## 2014-09-08 DIAGNOSIS — I82509 Chronic embolism and thrombosis of unspecified deep veins of unspecified lower extremity: Secondary | ICD-10-CM

## 2014-09-08 DIAGNOSIS — M79609 Pain in unspecified limb: Secondary | ICD-10-CM

## 2014-09-08 HISTORY — PX: VENA CAVA FILTER PLACEMENT: SHX1085

## 2014-09-08 LAB — TYPE AND SCREEN
ABO/RH(D): A POS
ANTIBODY SCREEN: NEGATIVE

## 2014-09-08 LAB — POCT ACTIVATED CLOTTING TIME: ACTIVATED CLOTTING TIME: 123 s

## 2014-09-08 SURGERY — INSERTION, FILTER, VENA CAVA
Anesthesia: General | Site: Abdomen

## 2014-09-08 MED ORDER — LABETALOL HCL 5 MG/ML IV SOLN: 10.0000 mg | INTRAVENOUS | Status: DC | PRN

## 2014-09-08 MED ORDER — PHENYLEPHRINE HCL 10 MG/ML IJ SOLN
INTRAMUSCULAR | Status: AC
  Filled 2014-09-08: qty 1

## 2014-09-08 MED ORDER — SUCCINYLCHOLINE CHLORIDE 20 MG/ML IJ SOLN
INTRAMUSCULAR | Status: AC
  Filled 2014-09-08: qty 1

## 2014-09-08 MED ORDER — HEPARIN SODIUM (PORCINE) 5000 UNIT/ML IJ SOLN
INTRAMUSCULAR | Status: DC | PRN
  Administered 2014-09-08: 08:00:00

## 2014-09-08 MED ORDER — LIDOCAINE HCL (CARDIAC) 20 MG/ML IV SOLN
INTRAVENOUS | Status: AC
  Filled 2014-09-08: qty 5

## 2014-09-08 MED ORDER — LIDOCAINE HCL (PF) 1 % IJ SOLN
INTRAMUSCULAR | Status: AC
  Filled 2014-09-08: qty 30

## 2014-09-08 MED ORDER — ACETAMINOPHEN 325 MG RE SUPP: 325.0000 mg | RECTAL | Status: DC | PRN

## 2014-09-08 MED ORDER — MORPHINE SULFATE 2 MG/ML IJ SOLN: 2.0000 mg | INTRAMUSCULAR | Status: DC | PRN

## 2014-09-08 MED ORDER — PHENYLEPHRINE HCL 10 MG/ML IJ SOLN
10.0000 mg | INTRAVENOUS | Status: DC | PRN
  Administered 2014-09-08: 20 ug/min via INTRAVENOUS

## 2014-09-08 MED ORDER — HYDRALAZINE HCL 20 MG/ML IJ SOLN: 10.0000 mg | INTRAMUSCULAR | Status: DC | PRN

## 2014-09-08 MED ORDER — ONDANSETRON HCL 4 MG/2ML IJ SOLN
INTRAMUSCULAR | Status: AC
  Filled 2014-09-08: qty 2

## 2014-09-08 MED ORDER — MIDAZOLAM HCL 2 MG/2ML IJ SOLN
INTRAMUSCULAR | Status: AC
  Filled 2014-09-08: qty 2

## 2014-09-08 MED ORDER — ROCURONIUM BROMIDE 50 MG/5ML IV SOLN
INTRAVENOUS | Status: AC
Start: 2014-09-08 — End: 2014-09-08
  Filled 2014-09-08: qty 1

## 2014-09-08 MED ORDER — IODIXANOL 320 MG/ML IV SOLN
INTRAVENOUS | Status: DC | PRN
Start: 2014-09-08 — End: 2014-09-08
  Administered 2014-09-08: 60.1 mL via INTRAVENOUS

## 2014-09-08 MED ORDER — LIDOCAINE HCL (CARDIAC) 20 MG/ML IV SOLN
INTRAVENOUS | Status: DC | PRN
  Administered 2014-09-08: 30 mg via INTRAVENOUS

## 2014-09-08 MED ORDER — ACETAMINOPHEN 325 MG PO TABS: 325.0000 mg | ORAL_TABLET | ORAL | Status: DC | PRN

## 2014-09-08 MED ORDER — SODIUM CHLORIDE 0.9 % IV SOLN: Freq: Once | INTRAVENOUS | Status: DC

## 2014-09-08 MED ORDER — NEOSTIGMINE METHYLSULFATE 10 MG/10ML IV SOLN
INTRAVENOUS | Status: DC | PRN
  Administered 2014-09-08: 4 mg via INTRAVENOUS

## 2014-09-08 MED ORDER — LACTATED RINGERS IV SOLN
INTRAVENOUS | Status: DC | PRN
  Administered 2014-09-08 (×3): via INTRAVENOUS

## 2014-09-08 MED ORDER — ONDANSETRON HCL 4 MG/2ML IJ SOLN
INTRAMUSCULAR | Status: DC | PRN
  Administered 2014-09-08: 4 mg via INTRAVENOUS

## 2014-09-08 MED ORDER — ROCURONIUM BROMIDE 100 MG/10ML IV SOLN
INTRAVENOUS | Status: DC | PRN
  Administered 2014-09-08: 40 mg via INTRAVENOUS
  Administered 2014-09-08: 10 mg via INTRAVENOUS

## 2014-09-08 MED ORDER — 0.9 % SODIUM CHLORIDE (POUR BTL) OPTIME
TOPICAL | Status: DC | PRN
  Administered 2014-09-08: 1000 mL

## 2014-09-08 MED ORDER — EPHEDRINE SULFATE 50 MG/ML IJ SOLN
INTRAMUSCULAR | Status: AC
  Filled 2014-09-08: qty 1

## 2014-09-08 MED ORDER — EPHEDRINE SULFATE 50 MG/ML IJ SOLN
INTRAMUSCULAR | Status: DC | PRN
  Administered 2014-09-08: 10 mg via INTRAVENOUS

## 2014-09-08 MED ORDER — FENTANYL CITRATE 0.05 MG/ML IJ SOLN
INTRAMUSCULAR | Status: AC
  Filled 2014-09-08: qty 5

## 2014-09-08 MED ORDER — GLYCOPYRROLATE 0.2 MG/ML IJ SOLN
INTRAMUSCULAR | Status: DC | PRN
  Administered 2014-09-08: 0.6 mg via INTRAVENOUS

## 2014-09-08 MED ORDER — MIDAZOLAM HCL 5 MG/5ML IJ SOLN
INTRAMUSCULAR | Status: DC | PRN
  Administered 2014-09-08: 1 mg via INTRAVENOUS

## 2014-09-08 MED ORDER — VECURONIUM BROMIDE 10 MG IV SOLR
INTRAVENOUS | Status: DC | PRN
  Administered 2014-09-08: 1 mg via INTRAVENOUS
  Administered 2014-09-08 (×2): 3 mg via INTRAVENOUS
  Administered 2014-09-08 (×2): 1 mg via INTRAVENOUS

## 2014-09-08 MED ORDER — FENTANYL CITRATE 0.05 MG/ML IJ SOLN
INTRAMUSCULAR | Status: DC | PRN
Start: 2014-09-08 — End: 2014-09-08
  Administered 2014-09-08 (×2): 25 ug via INTRAVENOUS
  Administered 2014-09-08: 50 ug via INTRAVENOUS
  Administered 2014-09-08: 25 ug via INTRAVENOUS
  Administered 2014-09-08: 50 ug via INTRAVENOUS
  Administered 2014-09-08: 25 ug via INTRAVENOUS

## 2014-09-08 MED ORDER — PROPOFOL 10 MG/ML IV BOLUS
INTRAVENOUS | Status: DC | PRN
  Administered 2014-09-08: 250 mg via INTRAVENOUS

## 2014-09-08 MED ORDER — SODIUM CHLORIDE 0.45 % IV SOLN: INTRAVENOUS | Status: DC

## 2014-09-08 MED ORDER — PROPOFOL 10 MG/ML IV BOLUS
INTRAVENOUS | Status: AC
Start: 2014-09-08 — End: 2014-09-08
  Filled 2014-09-08: qty 20

## 2014-09-08 MED ORDER — ONDANSETRON HCL 4 MG/2ML IJ SOLN: 4.0000 mg | Freq: Four times a day (QID) | INTRAMUSCULAR | Status: DC | PRN

## 2014-09-08 MED ORDER — FENTANYL CITRATE 0.05 MG/ML IJ SOLN: 25.0000 ug | INTRAMUSCULAR | Status: DC | PRN

## 2014-09-08 MED ORDER — HEPARIN SODIUM (PORCINE) 1000 UNIT/ML IJ SOLN
INTRAMUSCULAR | Status: DC | PRN
  Administered 2014-09-08 (×3): 5000 [IU] via INTRAVENOUS

## 2014-09-08 MED ORDER — OXYCODONE HCL 5 MG PO TABS: 5.0000 mg | ORAL_TABLET | ORAL | Status: DC | PRN

## 2014-09-08 SURGICAL SUPPLY — 70 items
ADH SKN CLS APL DERMABOND .7 (GAUZE/BANDAGES/DRESSINGS) ×2
BAG DECANTER FOR FLEXI CONT (MISCELLANEOUS) ×2 IMPLANT
BAG SNAP BAND KOVER 36X36 (MISCELLANEOUS) ×1 IMPLANT
BALLN CODA OCL 2-10-35-140-46 (BALLOONS) ×2
BALLN MUSTANG 10.0X40 75 (BALLOONS) ×2
BALLOON COD OCL 2-10-35-140-46 (BALLOONS) IMPLANT
BALLOON MUSTANG 10.0X40 75 (BALLOONS) IMPLANT
BLADE SURG 11 STRL SS (BLADE) ×2 IMPLANT
CATH BEACON 5.038 65CM KMP-01 (CATHETERS) ×1 IMPLANT
CATH OMNI FLUSH .035X70CM (CATHETERS) ×1 IMPLANT
COVER PROBE W GEL 5X96 (DRAPES) ×1 IMPLANT
COVER SURGICAL LIGHT HANDLE (MISCELLANEOUS) ×2 IMPLANT
COVER TABLE BACK 60X90 (DRAPES) ×2 IMPLANT
DECANTER SPIKE VIAL GLASS SM (MISCELLANEOUS) ×2 IMPLANT
DERMABOND ADVANCED (GAUZE/BANDAGES/DRESSINGS) ×2
DERMABOND ADVANCED .7 DNX12 (GAUZE/BANDAGES/DRESSINGS) IMPLANT
DEVICE ENSNARE  12MMX20MM (VASCULAR PRODUCTS) ×3
DEVICE ENSNARE 12MMX20MM (VASCULAR PRODUCTS) IMPLANT
DEVICE TORQUE H2O (MISCELLANEOUS) ×1 IMPLANT
DRAPE C-ARM 42X72 X-RAY (DRAPES) ×1 IMPLANT
DRAPE CHEST BREAST 15X10 FENES (DRAPES) ×2 IMPLANT
DRAPE INCISE IOBAN 66X45 STRL (DRAPES) ×2 IMPLANT
DRYSEAL FLEXSHEATH 12FR 33CM (SHEATH) ×1
DRYSEAL FLEXSHEATH 14FR 33CM (SHEATH) ×1
GAUZE SPONGE 4X4 12PLY STRL (GAUZE/BANDAGES/DRESSINGS) ×2 IMPLANT
GAUZE SPONGE 4X4 16PLY XRAY LF (GAUZE/BANDAGES/DRESSINGS) IMPLANT
GLOVE BIO SURGEON STRL SZ7.5 (GLOVE) ×2 IMPLANT
GLOVE BIOGEL PI IND STRL 7.5 (GLOVE) IMPLANT
GLOVE BIOGEL PI INDICATOR 7.5 (GLOVE) ×1
GLOVE SURG SS PI 7.5 STRL IVOR (GLOVE) ×1 IMPLANT
GOWN STRL REUS W/ TWL LRG LVL3 (GOWN DISPOSABLE) ×3 IMPLANT
GOWN STRL REUS W/TWL LRG LVL3 (GOWN DISPOSABLE) ×6
GUIDEWIRE ANGLED .035X260CM (WIRE) ×3 IMPLANT
KIT BASIN OR (CUSTOM PROCEDURE TRAY) ×2 IMPLANT
KIT ENCORE 26 ADVANTAGE (KITS) ×1 IMPLANT
KIT ROOM TURNOVER OR (KITS) ×2 IMPLANT
NDL HYPO 25GX1X1/2 BEV (NEEDLE) ×1 IMPLANT
NDL PERC 18GX7CM (NEEDLE) ×1 IMPLANT
NEEDLE HYPO 25GX1X1/2 BEV (NEEDLE) ×2 IMPLANT
NEEDLE PERC 18GX7CM (NEEDLE) ×2 IMPLANT
NS IRRIG 1000ML POUR BTL (IV SOLUTION) ×2 IMPLANT
PACK SURGICAL SETUP 50X90 (CUSTOM PROCEDURE TRAY) ×2 IMPLANT
PAD ARMBOARD 7.5X6 YLW CONV (MISCELLANEOUS) ×4 IMPLANT
PROTECTION STATION PRESSURIZED (MISCELLANEOUS) ×2
SET MICROPUNCTURE 5F STIFF (MISCELLANEOUS) ×1 IMPLANT
SET VENACAVA FILTER RETRIEVAL (MISCELLANEOUS) ×1 IMPLANT
SHEATH BRITE TIP 7FRX11 (SHEATH) ×3 IMPLANT
SHEATH DRYSEAL FLEX 12FR 33CM (SHEATH) IMPLANT
SHEATH DRYSEAL FLEX 14FR 33CM (SHEATH) IMPLANT
SHIELD RADPAD SCOOP 12X17 (MISCELLANEOUS) ×2 IMPLANT
SNARE GOOSENECK 10MM (VASCULAR PRODUCTS) ×1 IMPLANT
SNARE GOOSENECK 15MM (MISCELLANEOUS) ×1 IMPLANT
SPONGE LAP 18X18 X RAY DECT (DISPOSABLE) IMPLANT
STATION PROTECTION PRESSURIZED (MISCELLANEOUS) IMPLANT
STOPCOCK MORSE 400PSI 3WAY (MISCELLANEOUS) ×2 IMPLANT
SUT ETHILON 3 0 PS 1 (SUTURE) IMPLANT
SYR 20ML ECCENTRIC (SYRINGE) ×2 IMPLANT
SYR 30ML LL (SYRINGE) ×2 IMPLANT
SYR 5ML LL (SYRINGE) ×2 IMPLANT
SYR CONTROL 10ML LL (SYRINGE) ×2 IMPLANT
SYR MEDRAD MARK V 150ML (SYRINGE) ×1 IMPLANT
SYRINGE 10CC LL (SYRINGE) ×2 IMPLANT
TOWEL OR 17X24 6PK STRL BLUE (TOWEL DISPOSABLE) ×2 IMPLANT
TOWEL OR 17X26 10 PK STRL BLUE (TOWEL DISPOSABLE) ×2 IMPLANT
TRAY FOLEY CATH 16FRSI W/METER (SET/KITS/TRAYS/PACK) ×1 IMPLANT
TUBING HIGH PRESSURE 120CM (CONNECTOR) ×1 IMPLANT
WATER STERILE IRR 1000ML POUR (IV SOLUTION) ×2 IMPLANT
WIRE AMPLATZ SS-J .035X260CM (WIRE) ×2 IMPLANT
WIRE BENTSON .035X145CM (WIRE) ×3 IMPLANT
WIRE J 3MM .035X145CM (WIRE) ×2 IMPLANT

## 2014-09-08 NOTE — Progress Notes (Signed)
Dr. Oneida Alar notified of patients complained of R arm being "tight", She claims it feels thight when she makes a fist. R radial pulse present, good capillary refill. Dr. Oneida Alar will come check patient after his surgery.

## 2014-09-08 NOTE — Progress Notes (Signed)
Pt complained of some swelling and tingling in fingers right hand in PACU.  The swelling sensation has now resolved.  She had a DVT ultrasound right arm and the tech got a very good view all the way into the subclavian with no obvious obstruction.  She has had no ooze from her sheath sites.  She still has some tingling in the 4th and 5 th digits right hand but this is improving.  Will plan on d/c home tonight around 630 when bedrest over.  Ruta Hinds, MD [facility] Office: [phone] Pager: [phone]

## 2014-09-08 NOTE — Discharge Instructions (Signed)
What to eat: ° °For your first meals, you should eat lightly; only small meals initially.  If you do not have nausea, you may eat larger meals.  Avoid spicy, greasy and heavy food.   ° °General Anesthesia, Adult, Care After  °Refer to this sheet in the next few weeks. These instructions provide you with information on caring for yourself after your procedure. Your health care provider may also give you more specific instructions. Your treatment has been planned according to current medical practices, but problems sometimes occur. Call your health care provider if you have any problems or questions after your procedure.  °WHAT TO EXPECT AFTER THE PROCEDURE  °After the procedure, it is typical to experience:  °Sleepiness.  °Nausea and vomiting. °HOME CARE INSTRUCTIONS  °For the first 24 hours after general anesthesia:  °Have a responsible person with you.  °Do not drive a car. If you are alone, do not take public transportation.  °Do not drink alcohol.  °Do not take medicine that has not been prescribed by your health care provider.  °Do not sign important papers or make important decisions.  °You may resume a normal diet and activities as directed by your health care provider.  °Change bandages (dressings) as directed.  °If you have questions or problems that seem related to general anesthesia, call the hospital and ask for the anesthetist or anesthesiologist on call. °SEEK MEDICAL CARE IF:  °You have nausea and vomiting that continue the day after anesthesia.  °You develop a rash. °SEEK IMMEDIATE MEDICAL CARE IF:  °You have difficulty breathing.  °You have chest pain.  °You have any allergic problems. °Document Released: 03/05/2001 Document Revised: 07/30/2013 Document Reviewed: 06/12/2013  °ExitCare® Patient Information ©2014 [employer].  ° °Tissue Adhesive Wound Care  ° ° °Some cuts and wounds can be closed with tissue adhesive. Adhesive is like glue. It holds the skin together and helps a wound heal faster.  This adhesive goes away on its own as the wound heals.  °HOME CARE  °Showers are allowed. Do not soak the wound in water. Do not take baths, swim, or use hot tubs. Do not use soaps or creams on your wound.  °If a bandage (dressing) was put on, change it as often as told by your doctor.  °Keep the bandage dry.  °Do not scratch, pick, or rub the adhesive.  °Do not put tape over the adhesive. The adhesive could come off.  °Protect the wound from another injury.  °Protect the wound from sun and tanning beds.  °Only take medicine as told by your doctor.  °Keep all doctor visits as told. °GET HELP RIGHT AWAY IF:  °Your wound is red, puffy (swollen), hot, or tender.  °You get a rash after the glue is put on.  °You have more pain in the wound.  °You have a red streak going away from the wound.  °You have yellowish-white fluid (pus) coming from the wound.  °You have more bleeding.  °You have a fever.  °You have chills and start to shake.  °You notice a bad smell coming from the wound.  °Your wound or adhesive breaks open. °MAKE SURE YOU:  °Understand these instructions.  °Will watch your condition.  °Will get help right away if you are not doing well or get worse. °Document Released: 09/05/2008 Document Revised: 09/17/2013 Document Reviewed: 06/18/2013  °ExitCare® Patient Information ©2015 [employer]. This information is not intended to replace advice given to you by your health care provider.   Make sure you discuss any questions you have with your health care provider.   Angiogram, Care After Refer to this sheet in the next few weeks. These instructions provide you with information on caring for yourself after your procedure. Your health care provider may also give you more specific instructions. Your treatment has been planned according to current medical practices, but problems sometimes occur. Call your health care provider if you have any problems or questions after your procedure.  WHAT TO EXPECT AFTER THE  PROCEDURE After your procedure, it is typical to have the following sensations: Minor discomfort or tenderness and a small bump at the catheter insertion site. The bump should usually decrease in size and tenderness within 1 to 2 weeks. Any bruising will usually fade within 2 to 4 weeks. HOME CARE INSTRUCTIONS  You may need to keep taking blood thinners if they were prescribed for you. Take medicines only as directed by your health care provider. Do not apply powder or lotion to the site. Do not take baths, swim, or use a hot tub until your health care provider approves. You may shower 24 hours after the procedure. Remove the bandage (dressing) and gently wash the site with plain soap and water. Gently pat the site dry. Inspect the site at least twice daily. Limit your activity for the first 48 hours. Do not bend, squat, or lift anything over 20 lb (9 kg) or as directed by your health care provider. Plan to have someone take you home after the procedure. Follow instructions about when you can drive or return to work. SEEK MEDICAL CARE IF: You get light-headed when standing up. You have drainage (other than a small amount of blood on the dressing). You have chills. You have a fever. You have redness, warmth, swelling, or pain at the insertion site. SEEK IMMEDIATE MEDICAL CARE IF:  You develop chest pain or shortness of breath, feel faint, or pass out. You have bleeding, swelling larger than a walnut, or drainage from the catheter insertion site. You develop pain, discoloration, coldness, or severe bruising in the leg or arm that held the catheter. You develop bleeding from any other place, such as the bowels. You may see bright red blood in your urine or stools, or your stools may appear black and tarry. You have heavy bleeding from the site. If this happens, hold pressure on the site. MAKE SURE YOU: Understand these instructions. Will watch your condition. Will get help right away if you  are not doing well or get worse. Document Released: 06/15/2005 Document Revised: 04/13/2014 Document Reviewed: 04/21/2013 [facility] Patient Information 2015 [place], [place]. This information is not intended to replace advice given to you by your health care provider. Make sure you discuss any questions you have with your health care provider.

## 2014-09-08 NOTE — Progress Notes (Signed)
Dr Oneida Alar in to speak with pt and husband.

## 2014-09-08 NOTE — Anesthesia Preprocedure Evaluation (Signed)
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 Plan for retrevial under GA, no anticoagulation    Neuro/Psych PSYCHIATRIC DISORDERS Depression negative neurological ROS     GI/Hepatic negative GI ROS, Neg liver ROS,   Endo/Other  Morbid obesity  Renal/GU negative Renal ROS     Musculoskeletal negative musculoskeletal ROS (+)   Abdominal   Peds  Hematology negative hematology ROS (+)   Anesthesia Other Findings   Reproductive/Obstetrics                           Anesthesia Physical Anesthesia Plan  ASA: II  Anesthesia Plan: General   Post-op Pain Management:    Induction: Intravenous  Airway Management Planned: Oral ETT  Additional Equipment: None  Intra-op Plan:   Post-operative Plan: Extubation in OR  Informed Consent: I have reviewed the patients History and Physical, chart, labs and discussed the procedure including the risks, benefits and alternatives for the proposed anesthesia with the patient or authorized representative who has indicated his/her understanding and acceptance.   Dental advisory given  Plan Discussed with: CRNA and Surgeon  Anesthesia Plan Comments:         Anesthesia Quick Evaluation

## 2014-09-08 NOTE — Transfer of Care (Signed)
Immediate Anesthesia Transfer of Care Note  Patient: Mary Bridges  Procedure(s) Performed: Procedure(s): RETRIEVALOF VENA-CAVA FILTER UNDER GENERAL ANESTHESIA (N/A)  Patient Location: PACU  Anesthesia Type:General  Level of Consciousness: awake and alert   Airway & Oxygen Therapy: Patient Spontanous Breathing and Patient connected to nasal cannula oxygen  Post-op Assessment: Report given to PACU RN, Post -op Vital signs reviewed and stable and Patient moving all extremities  Post vital signs: Reviewed and stable  Complications: No apparent anesthesia complications

## 2014-09-08 NOTE — Anesthesia Postprocedure Evaluation (Signed)
  Anesthesia Post-op Note  Patient: Mary Bridges  Procedure(s) Performed: Procedure(s): RETRIEVALOF VENA-CAVA FILTER UNDER GENERAL ANESTHESIA (N/A)  Patient Location: PACU  Anesthesia Type:General  Level of Consciousness: awake and alert   Airway and Oxygen Therapy: Patient Spontanous Breathing  Post-op Pain: none  Post-op Assessment: Post-op Vital signs reviewed, Patient's Cardiovascular Status Stable, Respiratory Function Stable, Patent Airway, No signs of Nausea or vomiting and Pain level controlled  Post-op Vital Signs: Reviewed and stable  Last Vitals:  Filed Vitals:   09/08/14 1554  BP:   Pulse:   Temp: 36.8 C  Resp:     Complications: No apparent anesthesia complications

## 2014-09-08 NOTE — H&P (Signed)
[facility]AY H&P  CC:  IVC filter removal  HPI: Pt with IVC filter placed for orthopedic procedure.  She is now ready for filter removal    Past Medical History   Diagnosis  Date   .  History of blood clots  05/1992       Left leg   .  Arthritis         Knees   .  DJD (degenerative joint disease) of knee     .  DJD (degenerative joint disease), lumbosacral     .  Peripheral vascular disease         dvt 93   .  Depression     .  Bladder infection  83       kidney infection during preg   .  Breast cancer, Right  04/21/2009       rt,lft     FH:  Non-Contributory  Social HX History   Substance Use Topics   .  Smoking status:  Never Smoker    .  Smokeless tobacco:  Never Used   .  Alcohol Use:  No     Allergies Allergies   Allergen  Reactions   .  Penicillins  Hives   .  Codeine  Rash   .  Erythromycin  Rash     No current facility-administered medications on file prior to encounter.   Current Outpatient Prescriptions on File Prior to Encounter  Medication Sig Dispense Refill  . acetaminophen (TYLENOL) 650 MG CR tablet Take 650 mg by mouth 2 (two) times daily as needed for pain.      . citalopram (CELEXA) 20 MG tablet Take 10 mg by mouth daily.       . fexofenadine (ALLEGRA ALLERGY) 180 MG tablet Take 180 mg by mouth daily.        Marland Kitchen ibuprofen (ADVIL,MOTRIN) 200 MG tablet Take 400 mg by mouth daily as needed (prior to excersize to prevent swelling.).       Marland Kitchen rOPINIRole (REQUIP) 2 MG tablet Take 2 mg by mouth at bedtime.        PHYSICAL EXAM    Filed Vitals:   09/08/14 0601  BP: 142/93  Pulse: 70  Temp: 98.5 F (36.9 C)  TempSrc: Oral  Resp: 20  SpO2: 98%    General:  WDWN in NAD HENT: WNL Eyes: Pupils equal Pulmonary: normal non-labored breathing , without Rales, rhonchi,  wheezing Cardiac: RRR, Extremities without ischemic changes, no Gangrene , no cellulitis; no open wounds;   Neuro A&O x 3; good sensation; motion in all  extremities  Impression: Needs filter removal  Plan: IvC filter removal today Ranbir Chew E @TODAY @ 7:51 AM

## 2014-09-08 NOTE — Progress Notes (Signed)
*  Preliminary Results* Right upper extremity venous duplex completed. Right upper extremity is negative for deep and superficial vein thrombosis.  Preliminary results discussed with Dr. Oneida Alar.  09/08/2014 3:24 PM  Maudry Mayhew, RVT, RDCS, RDMS

## 2014-09-08 NOTE — Op Note (Signed)
Procedure: Attempted removal of inferior vena cava filter  Preoperative diagnosis: Retained inferior vena cava filter  Postoperative diagnosis: Same  Anesthesia: General anesthesia  Co Surgeon: Annamarie Major MD  Indications: Patient is a 58 year old female who had an IVC filter placed approximately 3 months ago. Was placed prior to orthopedic procedure. She now returns for elective removal of the filter.  Operative findings: #1 patent inferior vena cava filter #2 tip of filter adherent to the inferior vena cava wall unable to snare #3 12 Fr right IJ sheath  #4 14 Fr right femoral sheath  Operative details: After obtaining informed consent, the patient was taken to the operating room. The patient was placed in supine position on the operating table. After induction of general anesthesia and endotracheal intubation, the patient's right groin and neck was prepped and draped in usual sterile fashion.  Ultrasound was used to identify the right common femoral vein. This had normal compressibility and respiratory variation. An introducer needle was used to cannulate the right common femoral vein and 035 versacore wire threaded up into the inferior vena cava under fluoroscopic guidance. A 7 French sheath was placed over the guidewire into the right common femoral vein. The 7 French sheath was thoroughly flushed with heparinized saline. A 5 French omniflush catheter was then placed over the guidewire and advanced up to the level of the inferior vena cava filter. An inferior venacavogram was performed. This showed a patent IVC filter with no evidence of clot. The 7 French sheath was left in place and the omniflush catheter was removed over a guidewire. At this point attention was then turned to the right neck. This had been previously prepped and draped in sterile fashion. Ultrasound was used to identify the right internal jugular vein. The right jugular vein was successfully cannulated and is after several  attempts.  An 53 versacore wire was then threaded through the right internal jugular vein and down into the inferior vena cava under fluoroscopic guidance. A 7 French sheath was advanced over the guidewire. Several attempts were made using a snare in an attempt to grab the top of the filter. However none of these were successful.  The tip of the filter was adherent to the wall of the inferior vena cava. Additional attempts were made with an end snare device and these were also unsuccessful. At this point I discussed the case with my partner Dr. Trula Slade. He scrubbed in at this point and we attempted to remove the filter coming from a jugular vein and femoral vein approach simultaneously. The 7 French sheath and a right femoral vein was upsized to a 14 [place] dryseal sheath over the guidewire. A 10 x 4 mm angioplasty balloon was then used to try to pry the filter off of the wall of the inferior vena cava. I was able to move the tip of the filter slightly. However several attempts to snare the filter again were unsuccessful. I then attempted to use a 16 mm balloon to pry the filter off the wall and after several attempts this was also unsuccessful. Several more attempts were made to snare the filter from above and again due to the filter being adherent to the wall we were unable to snag this.   At this point the 5 Fr Omniflush was placed over an 035 angled glidewire from the neck passed the filter.  The wire was then refluxed back on itself up into the chest.  We attempted to snare the glidewire several times using the  cook retrieval system but could still not pry the tip loose from the cava.  We then used long alligator forceps from the femoral approach and twirled these around the filter tip to try to pry it loose.  One of the filter struts was slightly bent during this process.  Next we again refluxed the glidewire back on itself and snared the tip so it was around the proximal struts of the filter and pulled  the entire wire both ends back out the sheath.  We then attempted to slide the filter recovery device over this again unsuccessfully because the filter tip would not pry loose.  Finally, we tried to free the filter by placing a 12 mm angioplasty balloon from the neck approach and inflated this gently but were still unable to remove the filter from the wall as the tip remained adherent.  At this point the patient had been on the table for several hours and I did not feel that we were making any progress toward removing the filter. Total fluoro time at this point was 81 minutes.  Therefore attempts to remove the filter were abandoned. The patient had been given 15000 units of heparin over the course of the case.  An ACT was checked.  Initially it was 275.  The patient was given 30 mg of protamine.   Repeat ACT was 125.  The 12 French sheath in the jugular vein and 16 French sheath in the right femoral vein were pulled and hemostasis obtained with direct pressure. The patient tolerated procedure well and there were no complications. The patient was taken to the holding area in stable condition.   Ruta Hinds, MD [facility] [place] [facility] Office: [phone] Pager: [phone]

## 2014-09-09 ENCOUNTER — Telehealth: Payer: Self-pay | Admitting: Vascular Surgery

## 2014-09-09 NOTE — Telephone Encounter (Addendum)
Message copied by Gena Fray on Wed Sep 09, 2014  2:48 PM ------      Message from: Mena Goes      Created: Tue Sep 08, 2014  2:28 PM      Regarding: Schedule                   ----- Message -----         From: Elam Dutch, MD         Sent: 09/08/2014  12:32 PM           To: [facility] Charge Pool            Attempted filter removal       Right femoral Right neck access      Multiple catheters used      Please have op note reviewed for charges            [facility] co surgeon      Pt needs follow up appt with me in 2 weeks with non contrast CT abd pelvis at that time.            Fields ------  09/09/14: spoke with patient re: appt, dpm

## 2014-09-10 ENCOUNTER — Encounter (HOSPITAL_COMMUNITY): Payer: Self-pay | Admitting: Vascular Surgery

## 2014-09-14 ENCOUNTER — Encounter (HOSPITAL_COMMUNITY): Payer: Self-pay | Admitting: Vascular Surgery

## 2014-09-23 ENCOUNTER — Encounter: Payer: Self-pay | Admitting: Vascular Surgery

## 2014-09-24 ENCOUNTER — Ambulatory Visit (INDEPENDENT_AMBULATORY_CARE_PROVIDER_SITE_OTHER): Payer: Self-pay | Admitting: Vascular Surgery

## 2014-09-24 ENCOUNTER — Ambulatory Visit
Admission: RE | Admit: 2014-09-24 | Discharge: 2014-09-24 | Disposition: A | Discharge status: Home / Self Care | Payer: 59 | Source: Ambulatory Visit | Attending: Vascular Surgery | Admitting: Vascular Surgery

## 2014-09-24 ENCOUNTER — Encounter: Payer: Self-pay | Admitting: Vascular Surgery

## 2014-09-24 VITALS — BP 130/77 | HR 86 | Ht 68.0 in | Wt 238.3 lb

## 2014-09-24 DIAGNOSIS — I82509 Chronic embolism and thrombosis of unspecified deep veins of unspecified lower extremity: Secondary | ICD-10-CM

## 2014-09-24 DIAGNOSIS — T82525D Displacement of umbrella device, subsequent encounter: Secondary | ICD-10-CM

## 2014-09-24 NOTE — Progress Notes (Signed)
Patient is a 58 year old female who returns for further followup today. She previously had a IVC filter placed prior to orthopedic procedure. We have attempted twice to remove this unsuccessfully. She returns today for followup after a noncontrast CT to recheck filter positioning and integrity. She denies any abdominal or back pain. She did have some right arm swelling post procedure but this has now completely resolved. Duplex scan in the hospital showed no evidence of DVT.  Past Medical History  Diagnosis Date  . History of blood clots 05/1992    Left leg  . Arthritis     Knees  . DJD (degenerative joint disease) of knee   . DJD (degenerative joint disease), lumbosacral   . Peripheral vascular disease     dvt 93  . Depression   . Bladder infection 83    kidney infection during preg  . Breast cancer, Right 04/21/2009    rt,lft  . Blood dyscrasia     followed by [facility] - annual visits, told that she does have a clotting factor that was a little abnormal     Review of systems: She denies shortness of breath. She denies chest pain.  Current Outpatient Prescriptions on File Prior to Visit  Medication Sig Dispense Refill  . acetaminophen (TYLENOL) 650 MG CR tablet Take 650 mg by mouth 2 (two) times daily as needed for pain.      Marland Kitchen anastrozole (ARIMIDEX) 1 MG tablet Take 1 mg by mouth daily.      . citalopram (CELEXA) 20 MG tablet Take 10 mg by mouth daily.       Marland Kitchen ibuprofen (ADVIL,MOTRIN) 200 MG tablet Take 400 mg by mouth daily as needed (prior to excersize to prevent swelling.).       Marland Kitchen rOPINIRole (REQUIP) 2 MG tablet Take 2 mg by mouth at bedtime.      . fexofenadine (ALLEGRA ALLERGY) 180 MG tablet Take 180 mg by mouth daily.         No current facility-administered medications on file prior to visit.    Physical exam:  Filed Vitals:   09/24/14 1058  BP: 130/77  Pulse: 86  Height: 5\' 8"  (1.727 m)  Weight: 238 lb 4.8 oz (108.092 kg)  SpO2: 100%    Right upper extremity:  No significant edema compared to left. Neck well-healed puncture site  Data: CT of the abdomen and pelvis without contrast is reviewed today. The filter remains within the inferior vena cava. 1 prolonged of the filter is slightly bent superiorly and appears to penetrate the caval wall but with no inflammation or extravasation. The tip of the filter is adjacent to the duodenum but with an obvious visible fat plane between the duodenum and the tip of the cava filter  Assessment: Non-retrieved IVC filter with overall reasonable integrity but with one bent prong.  I discussed with the patient that she may not be fully protected if she was to have a DVT. However, most of the filter is intact. I've also discussed with her that I do not believe we should make another attempt to remove the filter. If she develops symptoms down the road that would be a consideration. I also discussed the patient's case with partners Dr. early and Dr. Trula Slade who agreed that we should leave the filter at this point. I also discussed the case with Dr. Kathlene Cote who reviewed the CT scan today and also is with interventional radiology and he also agreed on leaving the filter at this point.  Plan: The patient will followup in 5 years with consideration for repeat CT scan at that time.  Ruta Hinds, MD [facility] [place] Office: [phone] Pager: [phone]

## 2014-11-19 ENCOUNTER — Encounter (HOSPITAL_COMMUNITY): Payer: Self-pay | Admitting: Vascular Surgery

## 2014-11-24 ENCOUNTER — Encounter: Payer: Self-pay | Admitting: Vascular Surgery

## 2014-11-24 ENCOUNTER — Other Ambulatory Visit: Payer: Self-pay

## 2014-11-24 DIAGNOSIS — M7989 Other specified soft tissue disorders: Secondary | ICD-10-CM

## 2014-11-24 DIAGNOSIS — I825Z2 Chronic embolism and thrombosis of unspecified deep veins of left distal lower extremity: Secondary | ICD-10-CM

## 2014-11-25 ENCOUNTER — Ambulatory Visit (INDEPENDENT_AMBULATORY_CARE_PROVIDER_SITE_OTHER): Payer: 59 | Admitting: Vascular Surgery

## 2014-11-25 ENCOUNTER — Ambulatory Visit (HOSPITAL_COMMUNITY)
Admission: RE | Admit: 2014-11-25 | Discharge: 2014-11-25 | Disposition: A | Discharge status: Home / Self Care | Payer: 59 | Source: Ambulatory Visit | Attending: Vascular Surgery | Admitting: Vascular Surgery

## 2014-11-25 ENCOUNTER — Encounter: Payer: Self-pay | Admitting: Vascular Surgery

## 2014-11-25 VITALS — BP 123/86 | HR 97 | Resp 16 | Ht 68.0 in | Wt 246.0 lb

## 2014-11-25 DIAGNOSIS — I82502 Chronic embolism and thrombosis of unspecified deep veins of left lower extremity: Secondary | ICD-10-CM | POA: Diagnosis present

## 2014-11-25 DIAGNOSIS — M7989 Other specified soft tissue disorders: Secondary | ICD-10-CM

## 2014-11-25 DIAGNOSIS — I825Z2 Chronic embolism and thrombosis of unspecified deep veins of left distal lower extremity: Secondary | ICD-10-CM

## 2014-11-25 MED ORDER — IOHEXOL 350 MG/ML SOLN
100.0000 mL | Freq: Once | INTRAVENOUS | Status: AC | PRN
  Administered 2014-11-25: 100 mL via INTRAVENOUS

## 2014-11-25 NOTE — Progress Notes (Signed)
Patient is a 58 year old female who returns for further followup today. 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 She previously had a IVC filter placed prior to orthopedic procedure. We attempted twice to remove this unsuccessfully. She has noticed acute onset of swelling in the left lower extremity that has now been going on for about 3 weeks. She states that occasionally she has some pain in her left leg after sitting at a computer for a lengthy period of time. She also notices some swelling if she is on her feet for a lengthy period of time. She has some swelling in the right leg but not as bad as the left. She has been on Coumadin in the past. She was most recently on Coumadin in June 2015 after her orthopedic procedure. She denies any abdominal or back pain.  She denies shortness of breath. She denies chest pain.    Past Medical History   Diagnosis  Date   .  History of blood clots  05/1992       Left leg   .  Arthritis         Knees   .  DJD (degenerative joint disease) of knee     .  DJD (degenerative joint disease), lumbosacral     .  Peripheral vascular disease         dvt 93   .  Depression     .  Bladder infection  83       kidney infection during preg   .  Breast cancer, Right  04/21/2009       rt,lft   .  Blood dyscrasia         followed by [facility] - annual visits, told that she does have a clotting factor that was a little abnormal    Current Outpatient Prescriptions on File Prior to Visit  Medication Sig Dispense Refill  . acetaminophen (TYLENOL) 650 MG CR tablet Take 650 mg by mouth 2 (two) times daily as needed for pain.    Marland Kitchen anastrozole (ARIMIDEX) 1 MG tablet Take 1 mg by mouth daily.    . citalopram (CELEXA) 20 MG tablet Take 10 mg by mouth daily.     Marland Kitchen rOPINIRole (REQUIP) 2 MG tablet Take 2 mg by mouth at bedtime.    . fexofenadine (ALLEGRA ALLERGY) 180 MG tablet Take 180 mg by mouth daily.      Marland Kitchen ibuprofen (ADVIL,MOTRIN) 200 MG tablet Take  400 mg by mouth daily as needed (prior to excersize to prevent swelling.).     Marland Kitchen Sulfamethoxazole-Trimethoprim (BACTRIM PO) Take 1 tablet by mouth daily.     No current facility-administered medications on file prior to visit.    Physical exam:    Filed Vitals:   11/25/14 1350  BP: 123/86  Pulse: 97  Resp: 16  Height: 5\' 8"  (1.727 m)  Weight: 246 lb (111.585 kg)   Lower extremities: 2+ dorsalis pedis pulse bilaterally, feet pink and warm bilaterally, left leg slightly more swollen than the left approximate 10%. This extends from the thigh to the foot.  Data: Venous duplex was performed today in our office. I reviewed and interpreted this study. This was done of the left lower extremity. 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  However, due to her previous instrumentation and extensive procedure dealing with her IVC filter I believe we need to perform a CT venogram of the abdomen and pelvis to make sure that she has had no change in her cable or iliac system.  Plan: Patient will be scheduled for a CT venogram of the abdomen and pelvis today. I have also encouraged her to begin wearing her compression stockings again that she is to wear these at all times. She will be at risk of postphlebitic syndrome and swelling long-term. Her compression stocking should reduce the symptoms. If the CT scan today shows any significant thrombus burden we will need to consider restarting her anticoagulation.  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 No new thrombus with a patent  IVC filter with no significant changes. On this most likely her leg swelling is just acute exacerbation of chronic changes. Again treatment plan for now will be continued compression stockings. Hopefully this will improve with compression alone. The patient would benefit from wearing compression stockings at all times except in the evening.  Results of the CT scan were communicated with the patient.  Ruta Hinds, MD [facility] Office: [phone] Pager: [phone]

## 2014-12-07 ENCOUNTER — Telehealth: Payer: Self-pay | Admitting: Oncology

## 2014-12-07 NOTE — Telephone Encounter (Signed)
S/w pt to confirm appt chg from 1/25 (md pal) to 3/29. Pt is aware (MyChart Active) and d/t of chgd appts ok.

## 2014-12-28 ENCOUNTER — Other Ambulatory Visit: Payer: 59

## 2015-01-04 ENCOUNTER — Ambulatory Visit: Payer: 59 | Admitting: Oncology

## 2015-03-01 ENCOUNTER — Other Ambulatory Visit: Payer: Self-pay | Admitting: *Deleted

## 2015-03-01 DIAGNOSIS — E875 Hyperkalemia: Secondary | ICD-10-CM

## 2015-03-01 DIAGNOSIS — C50919 Malignant neoplasm of unspecified site of unspecified female breast: Secondary | ICD-10-CM

## 2015-03-02 ENCOUNTER — Other Ambulatory Visit (HOSPITAL_BASED_OUTPATIENT_CLINIC_OR_DEPARTMENT_OTHER): Payer: 59

## 2015-03-02 DIAGNOSIS — E875 Hyperkalemia: Secondary | ICD-10-CM

## 2015-03-02 DIAGNOSIS — C50919 Malignant neoplasm of unspecified site of unspecified female breast: Secondary | ICD-10-CM

## 2015-03-02 DIAGNOSIS — Z853 Personal history of malignant neoplasm of breast: Secondary | ICD-10-CM

## 2015-03-02 LAB — COMPREHENSIVE METABOLIC PANEL (CC13)
ALBUMIN: 3.5 g/dL (ref 3.5–5.0)
ALT: 25 U/L (ref 0–55)
AST: 19 U/L (ref 5–34)
Alkaline Phosphatase: 138 U/L (ref 40–150)
Anion Gap: 12 mEq/L — ABNORMAL HIGH (ref 3–11)
BUN: 17.4 mg/dL (ref 7.0–26.0)
CO2: 25 mEq/L (ref 22–29)
Calcium: 9.1 mg/dL (ref 8.4–10.4)
Chloride: 102 mEq/L (ref 98–109)
Creatinine: 0.8 mg/dL (ref 0.6–1.1)
EGFR: 80 mL/min/{1.73_m2} — AB (ref >90)
GLUCOSE: 143 mg/dL — AB (ref 70–140)
POTASSIUM: 3.6 meq/L (ref 3.5–5.1)
SODIUM: 139 meq/L (ref 136–145)
Total Bilirubin: 0.66 mg/dL (ref 0.20–1.20)
Total Protein: 7 g/dL (ref 6.4–8.3)

## 2015-03-02 LAB — CBC WITH DIFFERENTIAL/PLATELET
BASO%: 0.4 % (ref 0.0–2.0)
Basophils Absolute: 0 10*3/uL (ref 0.0–0.1)
EOS%: 2.3 % (ref 0.0–7.0)
Eosinophils Absolute: 0.2 10*3/uL (ref 0.0–0.5)
HCT: 37.6 % (ref 34.8–46.6)
HGB: 12.4 g/dL (ref 11.6–15.9)
LYMPH%: 30.9 % (ref 14.0–49.7)
MCH: 28.5 pg (ref 25.1–34.0)
MCHC: 33 g/dL (ref 31.5–36.0)
MCV: 86.4 fL (ref 79.5–101.0)
MONO#: 0.5 10*3/uL (ref 0.1–0.9)
MONO%: 7 % (ref 0.0–14.0)
NEUT#: 4.1 10*3/uL (ref 1.5–6.5)
NEUT%: 59.4 % (ref 38.4–76.8)
PLATELETS: 230 10*3/uL (ref 145–400)
RBC: 4.35 10*6/uL (ref 3.70–5.45)
RDW: 14.6 % — ABNORMAL HIGH (ref 11.2–14.5)
WBC: 6.9 10*3/uL (ref 3.9–10.3)
lymph#: 2.1 10*3/uL (ref 0.9–3.3)

## 2015-03-09 ENCOUNTER — Ambulatory Visit (HOSPITAL_BASED_OUTPATIENT_CLINIC_OR_DEPARTMENT_OTHER): Payer: 59 | Admitting: Oncology

## 2015-03-09 VITALS — BP 133/73 | HR 78 | Temp 98.3°F | Resp 18 | Ht 68.0 in | Wt 246.9 lb

## 2015-03-09 DIAGNOSIS — Z853 Personal history of malignant neoplasm of breast: Secondary | ICD-10-CM | POA: Diagnosis not present

## 2015-03-09 DIAGNOSIS — M1710 Unilateral primary osteoarthritis, unspecified knee: Secondary | ICD-10-CM

## 2015-03-09 DIAGNOSIS — I82502 Chronic embolism and thrombosis of unspecified deep veins of left lower extremity: Secondary | ICD-10-CM

## 2015-03-09 NOTE — Progress Notes (Signed)
[facility]  [phone] [phone]  OFFICE PROGRESS NOTE   ID: Mary Bridges   DOB: [birth year]  MR#: [id number]  OYD#:[id number]   PCP: Eloise Levels, NP GYN: Erik Obey. Helane Rima, MD SU: Neldon Mc, MD OTHER:  Elsie Saas, MD   HISTORY OF PRESENT ILLNESS: From Dr. Collier Salina Rubin's new patient evaluation dated 04/26/2009:  "This is a delightful 59 year old woman from [place], [place] here today with her husband for evaluation and treatment of breast cancer.  Mary Bridges has been in relatively good health.  She is here today with her husband.  She has not had regular mammogram since about 2007.  She had a screening mammogram followed by a diagnostic right mammogram and right breast ultrasound on 04/21/2009.  Physical exam showed a palpable thickening 12 o'clock position right breast with right nipple retraction.  Ultrasound showed a mass in the right breast 4 cm from the nipple measuring 6.3 x 2.8 x 5.1 cm.  Ultrasound of the right axilla showed at least some enlarged right axillary lymph nodes with attenuated fatty hila.  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  Prognostic panel is pending.  She is due for an MRI scan tomorrow.  She is seeing Dr. Brantley Stage who has in turn referred her for neoadjuvant therapy."  Her subsequent history is as detailed below.   INTERVAL HISTORY: Mary Bridges returns today for follow-up of her breast cancer. She continues on anastrozole. Generally she tolerates that well, with future no hot flashes and no significant issues regarding vaginal dryness. She never developed the arthralgias or myalgias that many patients can experience on this drug.   REVIEW OF SYSTEMS: Mary Bridges had bilateral knee replacements over the past year. She did very well with that and is very happy with the  results. She is now trying to get back into exercise. She sleeps on 2 pillows. Occasionally her ankles swell. Of course she always uses her left lower extremity compression stocking. She tells me her hemoglobin A1c is running between 6.0 and 6.6. She denies unusual headaches, visual changes, nausea, vomiting, dizziness, or gait imbalance. A detailed review of systems today was otherwise stable  PAST MEDICAL HISTORY: Past Medical History  Diagnosis Date  . History of blood clots 05/1992    Left leg  . Arthritis     Knees  . DJD (degenerative joint disease) of knee   . DJD (degenerative joint disease), lumbosacral   . Peripheral vascular disease     dvt 93  . Depression   . Bladder infection 83    kidney infection during preg  . Breast cancer, Right 04/21/2009    rt,lft  . Blood dyscrasia     followed by [facility] - annual visits, told that she does have a clotting factor that was a little abnormal     PAST SURGICAL HISTORY: Past Surgical History  Procedure Laterality Date  . Mastectomy Bilateral 09/22/09  . Wisdom tooth extraction      in her teens  . Novasure ablation  09/2005  . Refractive surgery Bilateral   . Port-a-cath removal  07/07/10  . Abdominal hysterectomy    . Meniscus repair Right 03/2012  . Gum surgery  2014    Gum Graft  . Portacath placement  10  . Bunionectomy Right 12  . Total knee arthroplasty Bilateral 04/20/2014  . Total  knee arthroplasty Bilateral 04/20/2014    Procedure: BILATERAL TOTAL KNEE ARTHROPLASTY ;  Surgeon: Lorn Junes, MD;  Location: [facility];  Service: Orthopedics;  Laterality: Bilateral;  . Vena cava filter placement N/A 09/08/2014    Procedure: RETRIEVALOF VENA-CAVA FILTER UNDER GENERAL ANESTHESIA;  Surgeon: Elam Dutch, MD;  Location: [facility];  Service: Vascular;  Laterality: N/A;  . Insertion of vena cava filter N/A 04/17/2014    Procedure: INSERTION OF VENA CAVA FILTER;  Surgeon: Elam Dutch, MD;  Location: [facility] CATH LAB;  Service:  Cardiovascular;  Laterality: N/A;  . Peripheral vascular catheterization  07/31/2014    Procedure: IVC/SVC VENOGRAPHY;  Surgeon: Elam Dutch, MD;  Location: [facility] CATH LAB;  Service: Cardiovascular;;    FAMILY HISTORY Family History  Problem Relation Age of Onset  . Cancer Father     Prostate, bone  . Stroke Mother    the patient's father died from prostate cancer the age of 38. The patient's mother was one of 12 siblings. The only cancer there was a colon cancer in a maternal aunt. The patient has one sister and one brother. There is no other history in the family to her knowledge. (Her own cancer was diagnosed when she was 77).   GYNECOLOGIC HISTORY:  (Updated February 2015) G5, P4, one stillbirth, menarche age 10, age of parity 60, menopausal since receiving chemotherapy in 2010.  Status post TAH/BSO in 01/2011.   SOCIAL HISTORY:  (Updated 02/04/14) The patient is from [place] but previously lived in [place] prior to coming to [place]. 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 Her husband is a Government social research officer for [employer].  They have four children.  She has a daughter living in [place], [place] who does art therapy, a son living in [place] who works in Engineer, technical sales, a son living in [place] who has a doctorate in pharmacy, and a son living at home who delivers [employer].  In her spare time she enjoys gardening and spending time with her pets.   ADVANCED DIRECTIVES: Not on file  HEALTH MAINTENANCE:  (Updated February 2015) History  Substance Use Topics  . Smoking status: Never Smoker   . Smokeless tobacco: Never Used  . Alcohol Use: No    Colonoscopy:  The patient reports her last colonoscopy in 2008.  PAP: April, 2014/Dr. Grewal.  Bone density: The patient states she had a bone density scan with Dr. Helane Rima in the Spring of 2014 that showed osteopenia.  Lipid panel: Not on file   Allergies  Allergen Reactions  . Penicillins Hives  . Codeine Rash  .  Erythromycin Rash    Current Outpatient Prescriptions  Medication Sig Dispense Refill  . acetaminophen (TYLENOL) 650 MG CR tablet Take 650 mg by mouth 2 (two) times daily as needed for pain.    Marland Kitchen anastrozole (ARIMIDEX) 1 MG tablet Take 1 mg by mouth daily.    . citalopram (CELEXA) 20 MG tablet Take 10 mg by mouth daily.     . fexofenadine (ALLEGRA ALLERGY) 180 MG tablet Take 180 mg by mouth daily.      Marland Kitchen ibuprofen (ADVIL,MOTRIN) 200 MG tablet Take 400 mg by mouth daily as needed (prior to excersize to prevent swelling.).     Marland Kitchen rOPINIRole (REQUIP) 2 MG tablet Take 2 mg by mouth at bedtime.    . Sulfamethoxazole-Trimethoprim (BACTRIM PO) Take 1 tablet by mouth daily.     No current facility-administered medications for this visit.  OBJECTIVE:  Middle-aged white woman who appears stated age 45 Vitals:   03/09/15 1453  BP: 133/73  Pulse: 78  Temp: 98.3 F (36.8 C)  Resp: 18     Body mass index is 37.55 kg/(m^2).    ECOG:  1 Filed Weights   03/09/15 1453  Weight: 246 lb 14.4 oz (111.993 kg)   Sclerae unicteric, pupils round and equal Oropharynx clear and moist No cervical or supraclavicular adenopathy Lungs no rales or rhonchi Heart regular rate and rhythm Abd soft, nontender, positive bowel sounds MSK no focal spinal tenderness, no upper extremity lymphedema Neuro: nonfocal, well oriented, appropriate affect Breasts: Status post bilateral mastectomies. On the right, where she had radiation, there are some telangiectasias. This is a normal development post radiation. There is no evidence of chest wall recurrence. Both axillae are benign.      LAB RESULTS: Lab Results  Component Value Date   WBC 6.9 03/02/2015   NEUTROABS 4.1 03/02/2015   HGB 12.4 03/02/2015   HCT 37.6 03/02/2015   MCV 86.4 03/02/2015   PLT 230 03/02/2015      Chemistry      Component Value Date/Time   NA 139 03/02/2015 1458   NA 140 07/31/2014 0616   K 3.6 03/02/2015 1458   K 3.8 07/31/2014  0616   CL 104 07/31/2014 0616   CL 103 02/04/2013 1234   CO2 25 03/02/2015 1458   CO2 22 04/22/2014 0435   BUN 17.4 03/02/2015 1458   BUN 15 07/31/2014 0616   CREATININE 0.8 03/02/2015 1458   CREATININE 0.70 07/31/2014 0616      Component Value Date/Time   CALCIUM 9.1 03/02/2015 1458   CALCIUM 8.5 04/22/2014 0435   ALKPHOS 138 03/02/2015 1458   ALKPHOS 95 04/21/2014 0800   AST 19 03/02/2015 1458   AST 18 04/21/2014 0800   ALT 25 03/02/2015 1458   ALT 19 04/21/2014 0800   BILITOT 0.66 03/02/2015 1458   BILITOT 0.7 04/21/2014 0800      STUDIES: CLINICAL DATA: Leg swelling, DVT  EXAM: CT ANGIOGRAPHY ABDOMEN AND PELVIS  TECHNIQUE: Multidetector CT imaging of the abdomen and pelvis was performed using the standard protocol during bolus administration of intravenous contrast. Multiplanar reconstructed images including MIPs were obtained and reviewed to evaluate the vascular anatomy.  CONTRAST: 138m OMNIPAQUE IOHEXOL 350 MG/ML SOLN  COMPARISON: COMPARISON 09/24/2014 and earlier studies including CT from 04/29/2009  FINDINGS: ARTERIAL FINDINGS:  Venous phase imaging only was requested and obtained. There is mild tortuosity of the abdominal aorta without significant atheromatous plaque, aneurysm, stenosis, or dissection. Visceral and renal vessels appear grossly unremarkable.  Venous findings:  Patent hepatic veins, portal vein, superior mesenteric vein, splenic vein, bilateral renal veins. There is an infrarenal IVC filter which is tilted, with caval wall perforation by at least 3 of the legs, as well as the apex. The cava does enhance homogeneously without evidence of chronic or acute thrombus.  The right iliac venous system is patent and unremarkable.  The left external and common iliac veins are markedly atretic or chronically occluded. There are large subcutaneous collateral pathways extending from the left saphenofemoral junction through  the subcutaneous tissues of the lower pelvis body wall and pubic region to the right saphenofemoral junction and right internal iliac vein. The visualized deep venous structures of bilateral lower extremities in the proximal thighs enhance normally, with no evidence of acute DVT. This appearance has been present since prior scan of 04/29/2009.  Review of the MIP  images confirms the above findings.  Nonvascular findings:  Visualized lung bases clear. Unremarkable liver, nondilated gallbladder, spleen, adrenal glands, pancreas, left kidney. 2.1 cm simple cysts, upper pole right kidney stable. No hydronephrosis. Stomach, small bowel, and colon are nondilated. Multiple diverticula from the descending segment of colon without adjacent inflammatory/ edematous change. No ascites. No free air. Urinary bladder incompletely distended. Bilateral pelvic phleboliths. No adenopathy localized. Advanced degenerative disc disease L5-S1 and facet disease L4-S1.  IMPRESSION: 1. 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 Stable appearance since 04/29/2009. 2. Patent right iliac venous system and IVC , with infrarenal filter in place. Critical results were called by telephone at the time of interpretation on 11/25/2014 at 4:11 pm to Dr. Ruta Hinds , who verbally acknowledged these results.   Electronically Signed  By: Arne Cleveland M.D.  On: 11/25/2014 16:12  ASSESSMENT: 59 y.o. [employer] woman:  (1) Status post right breast and right axillary lymph node biopsy 04/21/2009 both positive for a clinical T3, N1-2, stage III invasive mammary carcinoma with lobular features, grade 2  (2) bilateral breast MRI on 04/27/2009 showed the right breast was smaller than the left. There was asymmetric multicentric non mass-like plateau type  enhancement in the upper right breast, involving both the upper inner and upper outer quadrants. Biopsy clip artifact was noted within this area of asymmetric enhancement. The abnormal enhancement was estimated to measure 5.0 x 4.8 x 3.5 cm (transverse x AP x craniocaudal span).  The area of enhancement did not involve the pectoralis muscle. There were no suspicious areas of enhancement in the inferior right breast to suggest malignancy. There were no suspicious areas of enhancement in the left breast to suggest malignancy. There were multiple prominent right axillary lymph nodes. One of these had previously been biopsied and showed metastatic carcinoma. The largest lymph node identified, which demonstrated complete loss of the fatty hilum measured 1.1 x 2.0 cm in axial diameter. Left axillary lymph nodes had normal MRI appearances. No internal mammary chain lymphadenopathy was identified (clinical stage IIIA, T3 N1).  (3) Treated neoadjuvanly with 4 cycles of FEC (5-FU/epirubicin/Cytoxan) from 05/12/2009 through 06/24/1999 followed by Taxotere for 4 cycles from 07/07/2009 through 08/25/2009. The patient also received Lupron from 12/29/2009 through 05/25/2010 monthly and then from 07/20/2010 through 04/16/2011 given every 3 months.  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  (6) Radiation therapy from 10/25/2009 through 12/01/2009.  (7) The patient started anastrozole 12/2009, completing 5 years of treatment January 2016   (8) The patient underwent TAH/BSO in 01/2011.  (9) status post bilateral knee replacements 2015   PLAN:  Mary Bridges has completed 5 years of aromatase inhibitors. She understands this likely cut her risk of recurrence in half. We do not have category 1 data to suggest that continuing  beyond 5 years is useful, or harmful, or neither. The data simply stops at 5 years. Accordingly I am comfortable with her stopping this medication and releasing her from follow-up.  From a breast cancer point of view all she will need is a yearly physician chest wall exam.  I will be glad to see Mary Bridges at any point in the future if the need arises, but as of  now were making no further routine appointments for her here.  Chauncey Cruel, MD

## 2015-06-22 ENCOUNTER — Other Ambulatory Visit: Payer: Self-pay | Admitting: Obstetrics and Gynecology

## 2015-06-23 LAB — CYTOLOGY - PAP

## 2016-02-08 ENCOUNTER — Other Ambulatory Visit: Payer: Self-pay | Admitting: *Deleted

## 2016-02-08 DIAGNOSIS — T82515D Breakdown (mechanical) of umbrella device, subsequent encounter: Secondary | ICD-10-CM

## 2016-02-17 ENCOUNTER — Telehealth: Payer: Self-pay | Admitting: Vascular Surgery

## 2016-02-17 NOTE — Telephone Encounter (Signed)
notified patient of cta appt. on 03-09-16 at 9 am, and fu with dr. Oneida Alar on 03-16-16 at 9:15 am, mailed cta letter

## 2016-03-06 ENCOUNTER — Other Ambulatory Visit: Payer: Self-pay | Admitting: Vascular Surgery

## 2016-03-06 LAB — CREATININE, SERUM: Creat: 0.78 mg/dL (ref 0.50–1.05)

## 2016-03-08 ENCOUNTER — Other Ambulatory Visit: Payer: Self-pay | Admitting: *Deleted

## 2016-03-09 ENCOUNTER — Ambulatory Visit
Admission: RE | Admit: 2016-03-09 | Discharge: 2016-03-09 | Disposition: A | Discharge status: Home / Self Care | Payer: 59 | Source: Ambulatory Visit | Attending: Vascular Surgery | Admitting: Vascular Surgery

## 2016-03-09 DIAGNOSIS — T82515D Breakdown (mechanical) of umbrella device, subsequent encounter: Secondary | ICD-10-CM

## 2016-03-09 MED ORDER — IOPAMIDOL (ISOVUE-370) INJECTION 76%
75.0000 mL | Freq: Once | INTRAVENOUS | Status: AC | PRN
  Administered 2016-03-09: 75 mL via INTRAVENOUS

## 2016-03-10 ENCOUNTER — Encounter: Payer: Self-pay | Admitting: Vascular Surgery

## 2016-03-16 ENCOUNTER — Ambulatory Visit (INDEPENDENT_AMBULATORY_CARE_PROVIDER_SITE_OTHER): Payer: 59 | Admitting: Vascular Surgery

## 2016-03-16 ENCOUNTER — Encounter: Payer: Self-pay | Admitting: Vascular Surgery

## 2016-03-16 VITALS — BP 128/80 | HR 66 | Temp 97.5°F | Resp 18 | Ht 67.0 in | Wt 264.0 lb

## 2016-03-16 DIAGNOSIS — Z48812 Encounter for surgical aftercare following surgery on the circulatory system: Secondary | ICD-10-CM

## 2016-03-16 DIAGNOSIS — I87009 Postthrombotic syndrome without complications of unspecified extremity: Secondary | ICD-10-CM

## 2016-03-16 NOTE — Progress Notes (Signed)
Patient is a 60 year old female who returns for further followup today. 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 She previously had a IVC filter placed prior to orthopedic procedure in 2015. This was a Actor. We attempted twice to remove this unsuccessfully. She intermittently has some swelling in her left leg but really is not bothered by this. She states that occasionally she has some pain in her left leg after sitting at a computer for a lengthy period of time. She also notices some swelling if she is on her feet for a lengthy period of time. She has some swelling in the right leg but not as bad as the left. She has been on Coumadin in the past. She was most recently on Coumadin in June 2015 after her orthopedic procedure. This has now been discontinued. She denies any abdominal or back pain.  She denies shortness of breath. She denies chest pain.    Past Medical History    Diagnosis   Date    .   History of blood clots   05/1992          Left leg    .   Arthritis             Knees    .   DJD (degenerative joint disease) of knee       .   DJD (degenerative joint disease), lumbosacral       .   Peripheral vascular disease             dvt 93    .   Depression       .   Bladder infection   82          kidney infection during preg    .   Breast cancer, Right   04/21/2009          rt,lft    .   Blood dyscrasia             followed by [facility] - annual visits, told that she does have a clotting factor that was a little abnormal     Current Outpatient Prescriptions on File Prior to Visit  Medication Sig Dispense Refill  . citalopram (CELEXA) 20 MG tablet Take 40 mg by mouth daily.     Marland Kitchen rOPINIRole (REQUIP) 2 MG tablet Take 2 mg by mouth at bedtime.     No current facility-administered medications on file prior to visit.    Physical exam:     Filed Vitals:   03/16/16 0929  BP: 128/80  Pulse: 66  Temp: 97.5 F (36.4 C)  TempSrc: Oral  Resp: 18   Height: 5\' 7"  (1.702 m)  Weight: 264 lb (119.75 kg)  SpO2: 97%   Lower extremities: 2+ dorsalis pedis pulse bilaterally, feet pink and warm bilaterally, left leg essentially same diameter as right.   Data:  Recent CT angiogram the abdomen and pelvis is reviewed. The filter is still in its similar position and infrarenal area there is one strut of the filter which is bent superiorly. Otherwise no significant change. Chronic iliofemoral DVT left leg with pelvic collaterals no significant change.  Assessment: Chronic left iliofemoral DVT essentially asymptomatic at this point. The patient will continue to try to wear compression stockings long term to prevent leg swelling. She's had no changes in her previously placed IVC filter. We were unable to remove this on 2 prior occasions. I had a discussion  with the patient today that the filter has not changed in their is no really major risk although there are some minor risks of filter thrombosis long-term and with the damage to one strut she might not be protected a she developed an acute DVT. I discussed with her also the possibility of having Dr. Ida Rogue at [facility] attempt to remove the filter or evaluate her for this. She agreed to me discussing her case with Dr. Ida Rogue and if he thinks it is worthwhile for him to evaluate her for filter removal we will refer her in that direction.  Plan: Patient will have a follow-up venous ultrasound in 1 year if a decision is made to not remove the filter. I have also encouraged her to begin wearing her compression stockings again that she is to wear these at all times. She will be at risk of postphlebitic syndrome and swelling long-term. Her compression stocking should reduce the symptoms.   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 No  new thrombus with a patent IVC filter with no significant changes. On this most likely her leg swelling is just acute exacerbation of chronic changes. Again treatment plan for now will be continued compression stockings. Hopefully this will improve with compression alone. The patient would benefit from wearing compression stockings at all times except in the evening.  Results of the CT scan were communicated with the patient.  Ruta Hinds, MD [facility] Office: [phone] Pager: [phone]

## 2016-04-12 NOTE — Addendum Note (Signed)
Addended by: Mena Goes on: 04/12/2016 12:46 PM   Modules accepted: Orders

## 2016-07-20 ENCOUNTER — Encounter: Payer: Self-pay | Admitting: Vascular Surgery

## 2016-07-26 ENCOUNTER — Encounter: Payer: Self-pay | Admitting: Vascular Surgery

## 2016-07-26 ENCOUNTER — Encounter (HOSPITAL_BASED_OUTPATIENT_CLINIC_OR_DEPARTMENT_OTHER): Payer: 59

## 2016-07-26 ENCOUNTER — Ambulatory Visit (INDEPENDENT_AMBULATORY_CARE_PROVIDER_SITE_OTHER): Payer: 59 | Admitting: Vascular Surgery

## 2016-07-26 VITALS — BP 117/78 | HR 77 | Temp 97.7°F | Resp 16 | Ht 67.0 in | Wt 264.0 lb

## 2016-07-26 DIAGNOSIS — I83029 Varicose veins of left lower extremity with ulcer of unspecified site: Secondary | ICD-10-CM | POA: Diagnosis not present

## 2016-07-26 DIAGNOSIS — L97929 Non-pressure chronic ulcer of unspecified part of left lower leg with unspecified severity: Secondary | ICD-10-CM

## 2016-07-26 NOTE — Progress Notes (Signed)
Patient is a 60 year old female who presents with a nonhealing left pretibial wound that has been present for 4 months. She does not know exactly how it started. She has not had prior episodes like this. She has been wearing a Telfa dressing over this with a 20-30 mm compression stocking. 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 She intermittently has some swelling in her left leg but really is not bothered by this. She states that occasionally she has some pain in her left leg after sitting at a computer for a lengthy period of time. She also notices some swelling if she is on her feet for a lengthy period of time. She has some swelling in the right leg but not as bad as the left. She previously had a IVC filter placed prior to orthopedic procedure in 2015. This was a Actor. We attempted twice to remove this unsuccessfully. She has been on Coumadin in the past. This has now been discontinued. She denies any abdominal or back pain.  She denies shortness of breath. She denies chest pain.  She states she was recently diagnosed with borderline diabetes.   Past Medical History:  Diagnosis Date  . Arthritis    Knees  . Bladder infection 82   kidney infection during preg  . Blood dyscrasia    followed by [facility] - annual visits, told that she does have a clotting factor that was a little abnormal  . Breast cancer, Right 04/21/2009   rt,lft  . Depression   . DJD (degenerative joint disease) of knee   . DJD (degenerative joint disease), lumbosacral   . History of blood clots 05/1992   Left leg  . Peripheral vascular disease ([place])    dvt 93    Current Outpatient Prescriptions on File Prior to Visit  Medication Sig Dispense Refill  . b complex vitamins capsule Take 1 capsule by mouth daily.    . Calcium Carb-Cholecalciferol (CALCIUM 500 + D3) 500-600 MG-UNIT TABS Take by mouth daily.    . cetirizine (ZYRTEC) 10 MG tablet Take 10 mg by mouth daily.    .  Cholecalciferol (VITAMIN D3) 2000 units capsule Take 2,000 Units by mouth daily.    . citalopram (CELEXA) 20 MG tablet Take 40 mg by mouth daily.     . diclofenac (VOLTAREN) 75 MG EC tablet Take 75 mg by mouth 2 (two) times daily.    . Lutein 20 MG CAPS Take by mouth daily.    . magnesium gluconate (MAGONATE) 500 MG tablet Take 500 mg by mouth daily.    . metFORMIN (GLUCOPHAGE) 500 MG tablet Take 500 mg by mouth 2 (two) times daily with a meal.    . Omega-3 Fatty Acids (EQL OMEGA 3 FISH OIL) 1400 MG CAPS Take by mouth.    . pravastatin (PRAVACHOL) 40 MG tablet Take 40 mg by mouth daily.    Marland Kitchen rOPINIRole (REQUIP) 2 MG tablet Take 2 mg by mouth at bedtime.    . valsartan-hydrochlorothiazide (DIOVAN-HCT) 160-25 MG tablet Take 1 tablet by mouth daily.     No current facility-administered medications on file prior to visit.      Physical exam:   Vitals:   07/26/16 1017  BP: 117/78  Pulse: 77  Resp: 16  Temp: 97.7 F (36.5 C)  TempSrc: Oral  SpO2: 98%  Weight: 264 lb (119.7 kg)  Height: 5\' 7"  (1.702 m)    Lower extremities: 2+ dorsalis pedis pulse  bilaterally, feet pink and warm bilaterally, left leg essentially same diameter as right.  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    Plan: We'll place the patient in an Haematologist today. This will continue weekly for 4 weeks and I will see her back in follow-up.    Ruta Hinds, MD [facility] Office: [phone] Pager: [phone]

## 2016-07-28 ENCOUNTER — Encounter: Payer: Self-pay | Admitting: Family Medicine

## 2016-07-31 ENCOUNTER — Encounter: Payer: Self-pay | Admitting: Vascular Surgery

## 2016-08-03 ENCOUNTER — Ambulatory Visit: Payer: 59

## 2016-08-03 DIAGNOSIS — I83029 Varicose veins of left lower extremity with ulcer of unspecified site: Secondary | ICD-10-CM

## 2016-08-03 DIAGNOSIS — L97929 Non-pressure chronic ulcer of unspecified part of left lower leg with unspecified severity: Secondary | ICD-10-CM

## 2016-08-03 NOTE — Progress Notes (Signed)
Pt came in for an Unna boot change on her LLE.  She did not complain of pain and had very little drainage. Unna boot was applied to her left leg.  Pt says she will check with the front desk to reschedule her next dressing change because she will be going out of town.  Jory Sims, LPN.

## 2016-08-08 ENCOUNTER — Encounter: Payer: Self-pay | Admitting: Vascular Surgery

## 2016-08-10 ENCOUNTER — Ambulatory Visit: Payer: 59 | Admitting: Family

## 2016-08-10 DIAGNOSIS — L97929 Non-pressure chronic ulcer of unspecified part of left lower leg with unspecified severity: Secondary | ICD-10-CM

## 2016-08-10 DIAGNOSIS — I83029 Varicose veins of left lower extremity with ulcer of unspecified site: Secondary | ICD-10-CM | POA: Insufficient documentation

## 2016-08-10 NOTE — Progress Notes (Signed)
Mrs. Radebaugh was seen in our office today for a unna boot reapplication to her L LE. The previous unna boot was removed. A new unna boot was not applied due to to area on her L lower leg being healed per Dr. Oneida Alar. A dry 4x4 was placed over the area for protection. She will be seen as needed.

## 2016-08-18 ENCOUNTER — Encounter: Payer: Self-pay | Admitting: Vascular Surgery

## 2016-08-24 ENCOUNTER — Encounter: Payer: Self-pay | Admitting: Vascular Surgery

## 2016-08-24 ENCOUNTER — Ambulatory Visit (INDEPENDENT_AMBULATORY_CARE_PROVIDER_SITE_OTHER): Payer: 59 | Admitting: Vascular Surgery

## 2016-08-24 VITALS — BP 110/80 | HR 81 | Temp 98.4°F | Resp 16 | Ht 67.0 in | Wt 264.0 lb

## 2016-08-24 DIAGNOSIS — I87009 Postthrombotic syndrome without complications of unspecified extremity: Secondary | ICD-10-CM | POA: Diagnosis not present

## 2016-08-24 NOTE — Progress Notes (Signed)
Patient is a 60 year old female who presents with a nonhealing left pretibial wound that has been present for 4 months. She recently completed several weeks of Unna boot therapy. 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 She intermittently has some swelling in her left leg but really is not bothered by this. She states that occasionally she has some pain in her left leg after sitting at a computer for a lengthy period of time. She also notices some swelling if she is on her feet for a lengthy period of time. She has some swelling in the right leg but not as bad as the left. She previously had a IVC filter placed prior to orthopedic procedure in 2015. This was a Actor. We attempted twice to remove this unsuccessfully. She has been on Coumadin in the past. This has now been discontinued. She denies any abdominal or back pain.  She denies shortness of breath. She denies chest pain.  She states she was recently diagnosed with borderline diabetes.       Past Medical History:  Diagnosis Date  . Arthritis      Knees  . Bladder infection 83    kidney infection during preg  . Blood dyscrasia      followed by [facility] - annual visits, told that she does have a clotting factor that was a little abnormal  . Breast cancer, Right 04/21/2009    rt,lft  . Depression    . DJD (degenerative joint disease) of knee    . DJD (degenerative joint disease), lumbosacral    . History of blood clots 05/1992    Left leg  . Peripheral vascular disease ([place])      dvt 93            Current Outpatient Prescriptions on File Prior to Visit  Medication Sig Dispense Refill  . b complex vitamins capsule Take 1 capsule by mouth daily.      . Calcium Carb-Cholecalciferol (CALCIUM 500 + D3) 500-600 MG-UNIT TABS Take by mouth daily.      . cetirizine (ZYRTEC) 10 MG tablet Take 10 mg by mouth daily.      . Cholecalciferol (VITAMIN D3) 2000 units capsule Take 2,000 Units by mouth daily.       . citalopram (CELEXA) 20 MG tablet Take 40 mg by mouth daily.       . diclofenac (VOLTAREN) 75 MG EC tablet Take 75 mg by mouth 2 (two) times daily.      . Lutein 20 MG CAPS Take by mouth daily.      . magnesium gluconate (MAGONATE) 500 MG tablet Take 500 mg by mouth daily.      . metFORMIN (GLUCOPHAGE) 500 MG tablet Take 500 mg by mouth 2 (two) times daily with a meal.      . Omega-3 Fatty Acids (EQL OMEGA 3 FISH OIL) 1400 MG CAPS Take by mouth.      . pravastatin (PRAVACHOL) 40 MG tablet Take 40 mg by mouth daily.      Marland Kitchen rOPINIRole (REQUIP) 2 MG tablet Take 2 mg by mouth at bedtime.      . valsartan-hydrochlorothiazide (DIOVAN-HCT) 160-25 MG tablet Take 1 tablet by mouth daily.        No current facility-administered medications on file prior to visit.       Physical exam:    Vitals:   08/24/16 1122  BP: 110/80  Pulse: 81  Resp: 16  Temp:  98.4 F (36.9 C)  TempSrc: Oral  SpO2: 97%  Weight: 264 lb (119.7 kg)  Height: 5\' 7"  (1.702 m)     Lower extremities: 2+ dorsalis pedis pulse bilaterally, feet pink and warm bilaterally, left leg essentially same diameter as right.  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 Essentially healed at this point.   Plan: The patient will continue to wear her 30-40 mm compression stocking. She will follow-up with [place] on as-needed basis.    Ruta Hinds, MD [facility] Office: [phone] Pager: [phone]

## 2016-09-13 NOTE — Progress Notes (Signed)
[facility] PRE[facility] Weekly Session   Patient Details  Name: Mary Bridges MRN: [id number] Date of Birth: 09-08-1956 Age: 60 y.o. PCP: Eloise Levels, NP  Vitals:   09/13/16 1215  Weight: 267 lb (121.1 kg)        [facility] Weekly seesion - 09/13/16 1200      Weekly Session   Topic Discussed Hitting roadblocks   Minutes exercised this week 100 minutes  173mins cardio & 2 sessions upper body   Classes attended to date 3   Comments working on less late night snaking and going to bed earlier         [employer] 09/13/2016, 12:20 PM

## 2016-09-20 NOTE — Progress Notes (Signed)
[facility] PREP Weekly Session   Patient Details  Name: Mary Bridges MRN: [id number] Date of Birth: October 20, 1956 Age: 60 y.o. PCP: Eloise Levels, NP  Vitals:   09/20/16 1238  Weight: 267 lb (121.1 kg)        [facility] Weekly seesion - 09/20/16 1200      Weekly Session   Topic Discussed Water   Minutes exercised this week 190 minutes  100 cardio/90 strength   Classes attended to date 4   Comments "working on evening snacking.  Grateful for family"       [name] 09/20/2016, 12:55 PM

## 2016-09-20 NOTE — Progress Notes (Signed)
[facility] PREP Weekly Session   Patient Details  Name: [name] MRN: [id number] Date of Birth: November 10, 1956 Age: 60 y.o. PCP: Eloise Levels, NP  Vitals:   09/20/16 1238  Weight: 267 lb (121.1 kg)        [facility] Weekly seesion - 09/20/16 1200      Weekly Session   Topic Discussed Water   Minutes exercised this week 190 minutes  100 cardio/90 strength   Classes attended to date 4   Comments "working on evening snacking.  Grateful for family"       [name] 09/20/2016, 12:52 PM

## 2016-09-20 NOTE — Progress Notes (Signed)
[facility] PREP Weekly Session   Patient Details  Name: Mary Bridges MRN: [id number] Date of Birth: August 06, 1956 Age: 60 y.o. PCP: Eloise Levels, NP  Vitals:   09/20/16 1238  Weight: 267 lb (121.1 kg)       [name] 09/20/2016, 12:39 PM

## 2016-09-27 NOTE — Progress Notes (Signed)
[facility] PREP Weekly Session   Patient Details  Name: Mary Bridges MRN: [id number] Date of Birth: 19-May-1956 Age: 60 y.o. PCP: Eloise Levels, NP  Vitals:   09/27/16 1209  Weight: 267 lb (121.1 kg)        [facility] Weekly seesion - 09/20/16 1200      Weekly Session   Topic Discussed Water   Minutes exercised this week 190 minutes  100 cardio/90 strength   Classes attended to date 4   Comments "working on evening snacking.  Grateful for family"       [name] 09/27/2016, 12:11 PM

## 2016-10-04 NOTE — Progress Notes (Signed)
[facility] PREP Weekly Session   Patient Details  Name: Mary Bridges MRN: [id number] Date of Birth: 08-27-1956 Age: 60 y.o. PCP: Eloise Levels, NP  Vitals:   10/04/16 1248  Weight: 269 lb 9.6 oz (122.3 kg)        [facility] Weekly seesion - 10/04/16 1200      Weekly Session   Topic Discussed Other  Being a Fat and Calorie Detective   Minutes exercised this week 270 minutes  90 cardio/180 strength   Classes attended to date 6   Comments "struggling with snacking"  "grateful for this group, encouragement"       [name] 10/04/2016, 12:52 PM

## 2016-10-04 NOTE — Progress Notes (Signed)
[facility] PREP Weekly Session   Patient Details  Name: Mary Bridges MRN: [id number] Date of Birth: 06/29/1956 Age: 60 y.o. PCP: Eloise Levels, NP  Vitals:   10/04/16 1248  Weight: 269 lb 9.6 oz (122.3 kg)        [facility] Weekly seesion - 09/27/16 1200      Weekly Session   Topic Discussed Water   Minutes exercised this week 270 minutes  150 cardio/120 strength   Classes attended to date 5   Comments has been making healthier meals but says she's been staying up too late.       [name] 10/04/2016, 12:51 PM

## 2016-10-11 NOTE — Progress Notes (Signed)
[facility] PREP Weekly Session   Patient Details  Name: Mary Bridges MRN: [id number] Date of Birth: Mar 29, 1956 Age: 60 y.o. PCP: Eloise Levels, NP  Vitals:   10/11/16 1243  Weight: 266 lb (120.7 kg)        [facility] Weekly seesion - 10/11/16 1200      Weekly Session   Topic Discussed Healthy eating tips   Minutes exercised this week 210 minutes  90 cardio/120 strength   Classes attended to date [place] 10/11/2016, 12:44 PM

## 2016-10-18 NOTE — Progress Notes (Signed)
[facility] PREP Weekly Session   Patient Details  Name: SERI TARPINIAN MRN: [id number] Date of Birth: November 28, 1956 Age: 60 y.o. PCP: Eloise Levels, NP  Vitals:   10/18/16 1438  Weight: 264 lb 9.6 oz (120 kg)        [facility] Weekly seesion - 10/18/16 1400      Weekly Session   Topic Discussed Other ways to be active   Minutes exercised this week 210 minutes  90 cardio/120 strength   Classes attended to date 8   Comments "trying to watch portion sizes"       [name] 10/18/2016, 2:40 PM

## 2016-11-08 NOTE — Progress Notes (Signed)
[facility] PREP Weekly Session   Patient Details  Name: Mary Bridges MRN: [id number] Date of Birth: [birth year] Age: 60 y.o. PCP: Eloise Levels, NP  Vitals:   11/08/16 1256  Weight: 264 lb (119.7 kg)        [facility] Weekly seesion - 11/08/16 1200      Weekly Session   Topic Discussed Health habits   Minutes exercised this week 0 minutes  has been sick x 2 weeks   Classes attended to date [place] 11/08/2016, 12:57 PM

## 2016-11-15 NOTE — Progress Notes (Signed)
[facility] PREP Weekly Session   Patient Details  Name: Mary Bridges MRN: [id number] Date of Birth: 1956/07/26 Age: 60 y.o. PCP: Eloise Levels, NP  Vitals:   11/15/16 1319  Weight: 264 lb 12.8 oz (120.1 kg)        [facility] Weekly seesion - 11/15/16 1300      Weekly Session   Topic Discussed Restaurant Eating   Classes attended to date 10   Comments "thankful for family & encouragement but struggling over being sick w/low energy, and holiday stuff"       [name] 11/15/2016, 1:21 PM

## 2016-11-22 NOTE — Progress Notes (Signed)
[facility] PREP Weekly Session   Patient Details  Name: Mary Bridges MRN: [id number] Date of Birth: 03-21-1956 Age: 60 y.o. PCP: Eloise Levels, NP  Vitals:   11/22/16 1305  Weight: 265 lb (120.2 kg)        [facility] Weekly seesion - 11/22/16 1300      Weekly Session   Topic Discussed Stress management and problem solving   Minutes exercised this week 0 minutes   Classes attended to date 64   Comments "grateful for music, rest.  Not much appetite and struggling with still having a lingering cough"       [name] 11/22/2016, 1:06 PM

## 2016-12-12 DIAGNOSIS — J069 Acute upper respiratory infection, unspecified: Secondary | ICD-10-CM | POA: Diagnosis not present

## 2016-12-12 DIAGNOSIS — J310 Chronic rhinitis: Secondary | ICD-10-CM | POA: Diagnosis not present

## 2017-01-03 DIAGNOSIS — M62830 Muscle spasm of back: Secondary | ICD-10-CM | POA: Diagnosis not present

## 2017-01-03 DIAGNOSIS — M9903 Segmental and somatic dysfunction of lumbar region: Secondary | ICD-10-CM | POA: Diagnosis not present

## 2017-01-03 DIAGNOSIS — M5116 Intervertebral disc disorders with radiculopathy, lumbar region: Secondary | ICD-10-CM | POA: Diagnosis not present

## 2017-01-03 NOTE — Progress Notes (Signed)
[facility] PREP Weekly Session   Patient Details  Name: Mary Bridges MRN: [id number] Date of Birth: Oct 23, 1956 Age: 61 y.o. PCP: Eloise Levels, NP  Vitals:   01/03/17 1400  Weight: 264 lb (119.7 kg)        [facility] Weekly seesion - 01/03/17 1400      Weekly Session   Topic Discussed Finding support   Minutes exercised this week 0 minutes   Classes attended to date 33   Comments has been sick for more than a month       [name] 01/03/2017, 2:01 PM

## 2017-01-09 DIAGNOSIS — G2581 Restless legs syndrome: Secondary | ICD-10-CM | POA: Diagnosis not present

## 2017-01-09 DIAGNOSIS — E119 Type 2 diabetes mellitus without complications: Secondary | ICD-10-CM | POA: Diagnosis not present

## 2017-01-09 DIAGNOSIS — I82522 Chronic embolism and thrombosis of left iliac vein: Secondary | ICD-10-CM | POA: Diagnosis not present

## 2017-01-09 DIAGNOSIS — E785 Hyperlipidemia, unspecified: Secondary | ICD-10-CM | POA: Diagnosis not present

## 2017-01-19 NOTE — Progress Notes (Signed)
[facility] PREP Weekly Session   Patient Details  Name: Mary Bridges MRN: [id number] Date of Birth: 04-15-1956 Age: 61 y.o. PCP: Eloise Levels, NP  Vitals:   01/19/17 1019  Weight: 263 lb 6.4 oz (119.5 kg)        [facility] Weekly seesion - 01/19/17 1000      Weekly Session   Topic Discussed Hitting roadblocks   Classes attended to date 28     Fun things since last week: "paint" Things you are grateful for:"family" Nutrition celebrations for the week: "trying to stop eating earlier before bed" Barriers/struggles: "getting motivated after illness"   [name] 01/19/2017, 12:02 PM

## 2017-01-24 DIAGNOSIS — M62830 Muscle spasm of back: Secondary | ICD-10-CM | POA: Diagnosis not present

## 2017-01-24 DIAGNOSIS — M9903 Segmental and somatic dysfunction of lumbar region: Secondary | ICD-10-CM | POA: Diagnosis not present

## 2017-01-24 DIAGNOSIS — M5116 Intervertebral disc disorders with radiculopathy, lumbar region: Secondary | ICD-10-CM | POA: Diagnosis not present

## 2017-01-26 NOTE — Progress Notes (Signed)
[facility] PREP Weekly Session   Patient Details  Name: Mary Bridges MRN: [id number] Date of Birth: May 25, 1956 Age: 61 y.o. PCP: Eloise Levels, NP  Vitals:   01/26/17 1144  Weight: 263 lb 9.6 oz (119.6 kg)        [facility] Weekly seesion - 01/26/17 1100      Weekly Session   Topic Discussed Other  guest speaker   Classes attended to date 42     Fun things you did since last meeting::"paint"   [name] 01/26/2017, 11:44 AM

## 2017-02-02 NOTE — Progress Notes (Signed)
[facility] PREP Weekly Session   Patient Details  Name: Mary Bridges MRN: [id number] Date of Birth: 18-Nov-1956 Age: 61 y.o. PCP: Eloise Levels, NP  Vitals:   02/02/17 [id number]  Weight: 266 lb 3.2 oz (120.7 kg)        [facility] Weekly seesion - 02/02/17 0900      Weekly Session   Topic Discussed Other ways to be active   Classes attended to date 96      Fun things you did since last meeting:"trip to [place]" Things you are grateful for:"family"  [name] 02/02/2017, 9:40 AM

## 2017-02-09 NOTE — Progress Notes (Signed)
[facility] PREP Weekly Session   Patient Details  Name: ASALIA JESIONOWSKI MRN: [id number] Date of Birth: 29-Oct-1956 Age: 61 y.o. PCP: Eloise Levels, NP  Vitals:   02/09/17 1000  Weight: 264 lb 12.8 oz (120.1 kg)        [facility] Weekly seesion - 02/09/17 1000      Weekly Session   Topic Discussed Healthy eating tips   Minutes exercised this week 60 minutes  15 cardio/45 strength   Classes attended to date 67   Comments Gave graduation certificate today      Fun things you did since last meeting:"paint" Things you are grateful for:"warmer weather, family" Nutrition celebrations for the week:"not nutrition-but finally made it to a class post-illness" Barriers/struggles:"late night snacks"  [name] 02/09/2017, 10:01 AM

## 2017-02-14 DIAGNOSIS — M5116 Intervertebral disc disorders with radiculopathy, lumbar region: Secondary | ICD-10-CM | POA: Diagnosis not present

## 2017-02-14 DIAGNOSIS — M62830 Muscle spasm of back: Secondary | ICD-10-CM | POA: Diagnosis not present

## 2017-02-14 DIAGNOSIS — M9903 Segmental and somatic dysfunction of lumbar region: Secondary | ICD-10-CM | POA: Diagnosis not present

## 2017-02-21 NOTE — Progress Notes (Signed)
[facility] PREP Weekly Session   Patient Details  Name: Mary Bridges MRN: [id number] Date of Birth: 09/08/1956 Age: 61 y.o. PCP: Eloise Levels, NP  Vitals:   02/21/17 1253  Weight: 265 lb (120.2 kg)        [facility] Weekly seesion - 02/21/17 1200      Weekly Session   Topic Discussed Stress management and problem solving   Classes attended to date 45     Stated she is struggling with getting back into a regular exercise routine since having been sick for so long.  She was given encouragement to take baby steps back into it and scheduling exercise into her daily life.  Also talked about management of home clutter and how that is affecting her stress which leads to eating.     Mary Bridges 02/21/2017, 12:54 PM

## 2017-02-27 DIAGNOSIS — Z96653 Presence of artificial knee joint, bilateral: Secondary | ICD-10-CM | POA: Diagnosis not present

## 2017-03-07 DIAGNOSIS — M62830 Muscle spasm of back: Secondary | ICD-10-CM | POA: Diagnosis not present

## 2017-03-07 DIAGNOSIS — M9903 Segmental and somatic dysfunction of lumbar region: Secondary | ICD-10-CM | POA: Diagnosis not present

## 2017-03-07 DIAGNOSIS — M5116 Intervertebral disc disorders with radiculopathy, lumbar region: Secondary | ICD-10-CM | POA: Diagnosis not present

## 2017-03-09 NOTE — Progress Notes (Signed)
[name] PREP Weekly Session   Patient Details  Name: GUILLERMO DIFRANCESCO MRN: [id number] Date of Birth: 1956/05/14 Age: 61 y.o. PCP: Eloise Levels, NP  Vitals:   03/09/17 1404  Weight: 263 lb 12.8 oz (119.7 kg)        [facility] Weekly seesion - 03/09/17 1400      Weekly Session   Topic Discussed Expectations and non-scale victories   Minutes exercised this week 90 minutes  yoga   Classes attended to date 38     Fun things you did since last meeting:"paint" Things you are grateful for:"family encouragement"  [name] 03/09/2017, 2:05 PM

## 2017-03-28 DIAGNOSIS — M62830 Muscle spasm of back: Secondary | ICD-10-CM | POA: Diagnosis not present

## 2017-03-28 DIAGNOSIS — M5116 Intervertebral disc disorders with radiculopathy, lumbar region: Secondary | ICD-10-CM | POA: Diagnosis not present

## 2017-03-28 DIAGNOSIS — M9903 Segmental and somatic dysfunction of lumbar region: Secondary | ICD-10-CM | POA: Diagnosis not present

## 2017-03-30 NOTE — Progress Notes (Signed)
[facility] PREP Weekly Session   Patient Details  Name: Mary Bridges MRN: [id number] Date of Birth: January 11, 1956 Age: 61 y.o. PCP: Eloise Levels, NP  Vitals:   03/30/17 1238  Weight: 256 lb 8 oz (116.3 kg)        [facility] Weekly seesion - 03/30/17 1200      Weekly Session   Topic Discussed Calorie breakdown   Minutes exercised this week 180 minutes  90cardio/98flexibility   Classes attended to date 94     Fun things you did since last meeting:"paint, went to pickleball" Things you are grateful for:"health, family, friends"  [name] 03/30/2017, 12:38 PM

## 2017-04-09 NOTE — Progress Notes (Signed)
[place] Report   Patient Details  Name: Mary Bridges MRN: [id number] Date of Birth: 12-30-1955 Age: 61 y.o. PCP: Eloise Levels, NP  Vitals:   04/09/17 1312  BP: 118/78  Pulse: 72  Resp: 18  Weight: 257 lb (116.6 kg)         [facility] Eval - 04/09/17 1300      Referral    Referring Provider --  Eloise Levels   Reason for referral Obesitity/Overweight;Hypertension;Inactivity;Orthopedic;Diabetes     Measurement   Waist Circumference 44 inches   Hip Circumference 52.5 inches   Body fat 45 percent     Timed Up and Go (TUGS)   Timed Up and Go Low risk <9 seconds     Mobility and Daily Activities   I find it easy to walk up or down two or more flights of stairs. 3  was 3   I have no trouble taking out the trash. 4  was 4   I do housework such as vacuuming and dusting on my own without difficulty. 4  was 4   I can easily lift a gallon of milk (8lbs). 4  was 4   I can easily walk a mile. 3  was 2   I have no trouble reaching into high cupboards or reaching down to pick up something from the floor. 4  was 4   I do not have trouble doing out-door work such as Armed forces logistics/support/administrative officer, raking leaves, or gardening. 3  was 2     Mobility and Daily Activities   I feel younger than my age. 2  was 1   I feel independent. 4  was 4   I feel energetic. 3  was 2   I live an active life.  3  was 2   I feel strong. 3  was 2   I feel healthy. 3  was 2   I feel active as other people my age. 2  was 1     How fit and strong are you.   Fit and Strong Total Score 45     Past Medical History:  Diagnosis Date  . Arthritis    Knees  . Bladder infection 82   kidney infection during preg  . Blood dyscrasia    followed by [facility] - annual visits, told that she does have a clotting factor that was a little abnormal  . Breast cancer, Right 04/21/2009   rt,lft  . Depression   . DJD (degenerative joint disease) of knee   . DJD (degenerative joint disease),  lumbosacral   . History of blood clots 05/1992   Left leg  . Peripheral vascular disease ([place])    dvt 93   Past Surgical History:  Procedure Laterality Date  . ABDOMINAL HYSTERECTOMY    . BUNIONECTOMY Right 12  . GUM SURGERY  2014   Gum Graft  . INSERTION OF VENA CAVA FILTER N/A 04/17/2014   Procedure: INSERTION OF VENA CAVA FILTER;  Surgeon: Elam Dutch, MD;  Location: [facility] CATH LAB;  Service: Cardiovascular;  Laterality: N/A;  . MASTECTOMY Bilateral 09/22/09  . MENISCUS REPAIR Right 03/2012  . [place]  09/2005  . PERIPHERAL VASCULAR CATHETERIZATION  07/31/2014   Procedure: IVC/SVC VENOGRAPHY;  Surgeon: Elam Dutch, MD;  Location: [facility] CATH LAB;  Service: Cardiovascular;;  . PORT-A-CATH REMOVAL  07/07/10  . PORTACATH PLACEMENT  10  . REFRACTIVE SURGERY Bilateral   . TOTAL KNEE ARTHROPLASTY Bilateral 04/20/2014  .  TOTAL KNEE ARTHROPLASTY Bilateral 04/20/2014   Procedure: BILATERAL TOTAL KNEE ARTHROPLASTY ;  Surgeon: Lorn Junes, MD;  Location: [facility];  Service: Orthopedics;  Laterality: Bilateral;  . VENA CAVA FILTER PLACEMENT N/A 09/08/2014   Procedure: RETRIEVALOF VENA-CAVA FILTER UNDER GENERAL ANESTHESIA;  Surgeon: Elam Dutch, MD;  Location: [facility] OR;  Service: Vascular;  Laterality: N/A;  . WISDOM TOOTH EXTRACTION     in her teens   History  Smoking Status  . Never Smoker  Smokeless Tobacco  . Never Used     20 total Wellness sessions attended.  Comments:  Arnitra had several months in which she was ill with URI issues.  She seems to be much better [facility].  She is going to yoga classes a few times a weeks and trying to do her strength training/cardio on another day in the week.  She has visibly lost inches and is excited about the weight loss on the scale as well.  She plans to continue to come to the weekly class when she can.      [name] 04/09/2017, 1:24 PM

## 2017-04-12 ENCOUNTER — Ambulatory Visit: Payer: 59 | Admitting: Vascular Surgery

## 2017-04-12 ENCOUNTER — Encounter (HOSPITAL_COMMUNITY): Payer: 59

## 2017-04-18 DIAGNOSIS — M62830 Muscle spasm of back: Secondary | ICD-10-CM | POA: Diagnosis not present

## 2017-04-18 DIAGNOSIS — M9903 Segmental and somatic dysfunction of lumbar region: Secondary | ICD-10-CM | POA: Diagnosis not present

## 2017-04-18 DIAGNOSIS — M5116 Intervertebral disc disorders with radiculopathy, lumbar region: Secondary | ICD-10-CM | POA: Diagnosis not present

## 2017-04-20 NOTE — Progress Notes (Signed)
[facility] [employer] PREP Weekly Session   Patient Details  Name: GEORGANA ROMAIN MRN: [id number] Date of Birth: 12-08-1956 Age: 61 y.o. PCP: Eloise Levels, NP  Vitals:   04/18/17 1012  Weight: 258 lb 8 oz (117.3 kg)        [facility] [employer] Weekly seesion - 04/20/17 1000      Weekly Session   Topic Discussed Healthy eating tips   Minutes exercised this week 105 minutes  90cardio/15strength   Classes attended to date 31      Fun things you did since last meeting:"gardening, painting" Things you are grateful for:"family, friends, painting" Nutrition celebrations for the week:"eating more vegetables" Barriers:"snacking"  [name] 04/20/2017, 10:13 AM

## 2017-04-20 NOTE — Progress Notes (Signed)
[facility] [employer] PRE[facility] Weekly Session   Patient Details  Name: Mary Bridges MRN: [id number] Date of Birth: February 21, 1956 Age: 61 y.o. PCP: Eloise Levels, NP  Vitals:   04/20/17 0920  Weight: 257 lb (116.6 kg)        [facility] [employer] Weekly seesion - 04/20/17 0900      Weekly Session   Topic Discussed Other  fat calories   Minutes exercised this week 45 minutes  flexibility   Classes attended to date 76     Fun things you did since last meeting:"paint, trip w/sister" Things you are grateful for:"family, support" Nutrition celebrations for the week"eating better" Barriers:"less struggles than before my sister trip:) I have a great sister!"  [name] 04/20/2017, 9:21 AM

## 2017-04-25 NOTE — Progress Notes (Signed)
[facility] [employer] PRE[facility] Weekly Session   Patient Details  Name: Mary Bridges MRN: [id number] Date of Birth: December 14, 1955 Age: 61 y.o. PCP: Eloise Levels, NP  Vitals:   04/25/17 1322  Weight: 259 lb 3.2 oz (117.6 kg)        [facility] [employer] Weekly sees[facility] - 04/25/17 1300      Weekly Session   Topic Discussed Other ways to be active   Minutes exercised this week 100 minutes  25cardio/25strength/56flexibility   Classes attended to date 31    Fun things you did since last meeting:"Paint, garden" Things you are grateful for:"Family" Nutrition celebrations for the week:"more veg, less red meat, more fish" Barriers:"Still some late night snacking"  [name] 04/25/2017, 1:23 PM

## 2017-05-09 DIAGNOSIS — M9903 Segmental and somatic dysfunction of lumbar region: Secondary | ICD-10-CM | POA: Diagnosis not present

## 2017-05-09 DIAGNOSIS — M5116 Intervertebral disc disorders with radiculopathy, lumbar region: Secondary | ICD-10-CM | POA: Diagnosis not present

## 2017-05-09 DIAGNOSIS — M62830 Muscle spasm of back: Secondary | ICD-10-CM | POA: Diagnosis not present

## 2017-05-23 NOTE — Progress Notes (Signed)
[facility] [employer] PRE[facility] Weekly Session   Patient Details  Name: Mary Bridges MRN: [id number] Date of Birth: 02-Aug-1956 Age: 62 y.o. PCP: Eloise Levels, NP (Inactive)  Vitals:   05/23/17 1342  Weight: 257 lb 12.8 oz (116.9 kg)        [facility] [employer] Weekly sees[facility] - 05/23/17 1300      Weekly Session   Topic Discussed Other  guest speaker   Minutes exercised this week --  "yard work.walking"     Fun things you did since last meeting:"park, zoo, kids museum w/family, paint" Things you are grateful for:"family, friends" Nutrition celebrations for the week:"less red meat" Barriers:"snacking (but getting better)"   [name] 05/23/2017, 1:42 PM

## 2017-05-31 DIAGNOSIS — M62830 Muscle spasm of back: Secondary | ICD-10-CM | POA: Diagnosis not present

## 2017-05-31 DIAGNOSIS — M9903 Segmental and somatic dysfunction of lumbar region: Secondary | ICD-10-CM | POA: Diagnosis not present

## 2017-05-31 DIAGNOSIS — M5116 Intervertebral disc disorders with radiculopathy, lumbar region: Secondary | ICD-10-CM | POA: Diagnosis not present

## 2017-06-21 DIAGNOSIS — M5116 Intervertebral disc disorders with radiculopathy, lumbar region: Secondary | ICD-10-CM | POA: Diagnosis not present

## 2017-06-21 DIAGNOSIS — M9903 Segmental and somatic dysfunction of lumbar region: Secondary | ICD-10-CM | POA: Diagnosis not present

## 2017-06-21 DIAGNOSIS — M62830 Muscle spasm of back: Secondary | ICD-10-CM | POA: Diagnosis not present

## 2017-06-29 ENCOUNTER — Encounter: Payer: Self-pay | Admitting: Vascular Surgery

## 2017-07-02 DIAGNOSIS — E119 Type 2 diabetes mellitus without complications: Secondary | ICD-10-CM | POA: Diagnosis not present

## 2017-07-04 DIAGNOSIS — Z01419 Encounter for gynecological examination (general) (routine) without abnormal findings: Secondary | ICD-10-CM | POA: Diagnosis not present

## 2017-07-05 ENCOUNTER — Ambulatory Visit (INDEPENDENT_AMBULATORY_CARE_PROVIDER_SITE_OTHER): Payer: 59 | Admitting: Vascular Surgery

## 2017-07-05 ENCOUNTER — Encounter: Payer: Self-pay | Admitting: Vascular Surgery

## 2017-07-05 ENCOUNTER — Ambulatory Visit (HOSPITAL_COMMUNITY)
Admission: RE | Admit: 2017-07-05 | Discharge: 2017-07-05 | Disposition: A | Discharge status: Home / Self Care | Payer: 59 | Source: Ambulatory Visit | Attending: Vascular Surgery | Admitting: Vascular Surgery

## 2017-07-05 VITALS — BP 131/86 | HR 63 | Temp 97.5°F | Resp 16 | Ht 67.0 in | Wt 260.0 lb

## 2017-07-05 DIAGNOSIS — I82422 Acute embolism and thrombosis of left iliac vein: Secondary | ICD-10-CM | POA: Diagnosis not present

## 2017-07-05 DIAGNOSIS — Z452 Encounter for adjustment and management of vascular access device: Secondary | ICD-10-CM | POA: Diagnosis present

## 2017-07-05 DIAGNOSIS — I87009 Postthrombotic syndrome without complications of unspecified extremity: Secondary | ICD-10-CM

## 2017-07-05 DIAGNOSIS — Z48812 Encounter for surgical aftercare following surgery on the circulatory system: Secondary | ICD-10-CM | POA: Diagnosis not present

## 2017-07-05 NOTE — Progress Notes (Signed)
Patient is a 61 year old female who presents for follow-up. 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 She previously had a IVC filter placed prior to orthopedic procedure in 2015. This was a Actor. We attempted twice to remove this unsuccessfully. She has been on Coumadin in the past. This has now been discontinued. She denies any abdominal or back pain.  She denies shortness of breath. She denies chest pain.  She intermittently has some swelling in her left leg but really is not bothered by this. She did have 1 prior stasis ulcer on the left leg a few years ago but has not had an additional 1. She states that occasionally she has some pain in her left leg after sitting at a computer for a lengthy period of time. She also notices some swelling if she is on her feet for a lengthy period of time. She has some swelling in the right leg but not as bad as the left. She wears a compression stocking on the left lower extremity 30-40 mmHg. She is very diligent about wearing this.    Past Medical History:  Diagnosis Date  . Arthritis    Knees  . Bladder infection 83   kidney infection during preg  . Blood dyscrasia    followed by [facility] - annual visits, told that she does have a clotting factor that was a little abnormal  . Breast cancer, Right 04/21/2009   rt,lft  . Depression   . DJD (degenerative joint disease) of knee   . DJD (degenerative joint disease), lumbosacral   . History of blood clots 05/1992   Left leg  . Peripheral vascular disease ([place])    dvt 93  Prediabetes  Past Surgical History:  Procedure Laterality Date  . ABDOMINAL HYSTERECTOMY    . BUNIONECTOMY Right 13  . GUM SURGERY  2014   Gum Graft  . INSERTION OF VENA CAVA FILTER N/A 04/17/2014   Procedure: INSERTION OF VENA CAVA FILTER;  Surgeon: Elam Dutch, MD;  Location[facility] CATH LAB;  Service: Cardiovascular;  Laterality: N/A;  . MASTECTOMY Bilateral 09/22/09  . MENISCUS REPAIR Right  03/2012  . [place]  09/2005  . PERIPHERAL VASCULAR CATHETERIZATION  07/31/2014   Procedure: IVC/SVC VENOGRAPHY;  Surgeon: Elam Dutch, MD;  Location: [facility] CATH LAB;  Service: Cardiovascular;;  . PORT-A-CATH REMOVAL  07/07/10  . PORTACATH PLACEMENT  10  . REFRACTIVE SURGERY Bilateral   . TOTAL KNEE ARTHROPLASTY Bilateral 04/20/2014  . TOTAL KNEE ARTHROPLASTY Bilateral 04/20/2014   Procedure: BILATERAL TOTAL KNEE ARTHROPLASTY ;  Surgeon: Lorn Junes, MD;  Location: [facility];  Service: Orthopedics;  Laterality: Bilateral;  . VENA CAVA FILTER PLACEMENT N/A 09/08/2014   Procedure: RETRIEVALOF VENA-CAVA FILTER UNDER GENERAL ANESTHESIA;  Surgeon: Elam Dutch, MD;  Location: [facility] OR;  Service: Vascular;  Laterality: N/A;  . WISDOM TOOTH EXTRACTION     in her teens     Current Outpatient Prescriptions on File Prior to Visit  Medication Sig Dispense Refill  . b complex vitamins capsule Take 1 capsule by mouth daily.    . Calcium Carb-Cholecalciferol (CALCIUM 500 + D3) 500-600 MG-UNIT TABS Take by mouth daily.    . cetirizine (ZYRTEC) 10 MG tablet Take 10 mg by mouth daily.    . Cholecalciferol (VITAMIN D3) 2000 units capsule Take 2,000 Units by mouth daily.    . citalopram (CELEXA) 20 MG tablet Take 40 mg by mouth daily.     Marland Kitchen  diclofenac (VOLTAREN) 75 MG EC tablet Take 75 mg by mouth 2 (two) times daily.    . Lutein 20 MG CAPS Take by mouth daily.    . magnesium gluconate (MAGONATE) 500 MG tablet Take 500 mg by mouth daily.    . metFORMIN (GLUCOPHAGE) 500 MG tablet Take 500 mg by mouth 2 (two) times daily with a meal.    . Omega-3 Fatty Acids (EQL OMEGA 3 FISH OIL) 1400 MG CAPS Take by mouth.    . pravastatin (PRAVACHOL) 40 MG tablet Take 40 mg by mouth daily.    Marland Kitchen rOPINIRole (REQUIP) 2 MG tablet Take 2 mg by mouth at bedtime.    . valsartan-hydrochlorothiazide (DIOVAN-HCT) 160-25 MG tablet Take 1 tablet by mouth daily.     No current facility-administered medications on file prior  to visit.      Physical exam:      Vitals:   07/05/17 0934  BP: 131/86  Pulse: 63  Resp: 16  Temp: (!) 97.5 F (36.4 C)  TempSrc: Oral  SpO2: 97%  Weight: 260 lb (117.9 kg)  Height: 5\' 7"  (1.702 m)     Lower extremities: 2+ dorsalis pedis pulse bilaterally, feet pink and warm bilaterally, left leg essentially same diameter as right.  Skin:No ulcers    Data: CT angiogram of the abdomen and pelvis was reviewed today. This shows a patent IVC filter which is tilted slightly to the medial portion of the body , chronic left iliac vein occlusion   Venous duplex in our office today showed occlusion of the left  external iliac  vein.   right iliac and common femoral vein was patent. The inferior vena cava was patent.  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 Essentially healed at this point.   Plan: The patient will continue to wear her 30-40 mm compression stocking. She will follow-up with [place] in 5 years with repeat CT abdomen and pelvis.   Ruta Hinds, MD [facility] Office: [phone] Pager: [phone]

## 2017-07-09 DIAGNOSIS — Z131 Encounter for screening for diabetes mellitus: Secondary | ICD-10-CM | POA: Diagnosis not present

## 2017-07-09 DIAGNOSIS — Z13228 Encounter for screening for other metabolic disorders: Secondary | ICD-10-CM | POA: Diagnosis not present

## 2017-07-09 DIAGNOSIS — Z13220 Encounter for screening for lipoid disorders: Secondary | ICD-10-CM | POA: Diagnosis not present

## 2017-07-18 NOTE — Progress Notes (Signed)
[facility] [employer] PRE[facility] Weekly Session   Patient Details  Name: Mary Bridges MRN: [id number] Date of Birth: [birth year] Age: 61 y.o. PCP: No primary care provider on file.  Vitals:   07/18/17 1243  Weight: 257 lb 3.2 oz (116.7 kg)     Fun things you did since last meeting:"wedding(nephew), paint" Things you are grateful for:"family"   [name] 07/18/2017, 12:57 PM

## 2017-07-19 DIAGNOSIS — M5116 Intervertebral disc disorders with radiculopathy, lumbar region: Secondary | ICD-10-CM | POA: Diagnosis not present

## 2017-07-19 DIAGNOSIS — M62830 Muscle spasm of back: Secondary | ICD-10-CM | POA: Diagnosis not present

## 2017-07-19 DIAGNOSIS — M9903 Segmental and somatic dysfunction of lumbar region: Secondary | ICD-10-CM | POA: Diagnosis not present

## 2017-07-23 DIAGNOSIS — I10 Essential (primary) hypertension: Secondary | ICD-10-CM | POA: Diagnosis not present

## 2017-07-23 DIAGNOSIS — E785 Hyperlipidemia, unspecified: Secondary | ICD-10-CM | POA: Diagnosis not present

## 2017-07-23 DIAGNOSIS — E119 Type 2 diabetes mellitus without complications: Secondary | ICD-10-CM | POA: Diagnosis not present

## 2017-07-26 DIAGNOSIS — H40002 Preglaucoma, unspecified, left eye: Secondary | ICD-10-CM | POA: Diagnosis not present

## 2017-07-31 DIAGNOSIS — H40002 Preglaucoma, unspecified, left eye: Secondary | ICD-10-CM | POA: Diagnosis not present

## 2017-08-07 DIAGNOSIS — M9903 Segmental and somatic dysfunction of lumbar region: Secondary | ICD-10-CM | POA: Diagnosis not present

## 2017-08-07 DIAGNOSIS — M62830 Muscle spasm of back: Secondary | ICD-10-CM | POA: Diagnosis not present

## 2017-08-07 DIAGNOSIS — M5116 Intervertebral disc disorders with radiculopathy, lumbar region: Secondary | ICD-10-CM | POA: Diagnosis not present

## 2017-09-03 DIAGNOSIS — R229 Localized swelling, mass and lump, unspecified: Secondary | ICD-10-CM | POA: Diagnosis not present

## 2017-09-06 ENCOUNTER — Other Ambulatory Visit: Payer: Self-pay | Admitting: Family Medicine

## 2017-09-06 DIAGNOSIS — R229 Localized swelling, mass and lump, unspecified: Secondary | ICD-10-CM

## 2017-09-10 ENCOUNTER — Ambulatory Visit
Admission: RE | Admit: 2017-09-10 | Discharge: 2017-09-10 | Disposition: A | Discharge status: Home / Self Care | Payer: 59 | Source: Ambulatory Visit | Attending: Family Medicine | Admitting: Family Medicine

## 2017-09-10 ENCOUNTER — Other Ambulatory Visit: Payer: Self-pay | Admitting: Family Medicine

## 2017-09-10 DIAGNOSIS — R222 Localized swelling, mass and lump, trunk: Secondary | ICD-10-CM | POA: Diagnosis not present

## 2017-09-10 DIAGNOSIS — Z853 Personal history of malignant neoplasm of breast: Secondary | ICD-10-CM

## 2017-09-10 DIAGNOSIS — R609 Edema, unspecified: Secondary | ICD-10-CM

## 2017-09-10 DIAGNOSIS — R229 Localized swelling, mass and lump, unspecified: Secondary | ICD-10-CM

## 2017-09-10 DIAGNOSIS — Z9013 Acquired absence of bilateral breasts and nipples: Secondary | ICD-10-CM

## 2017-09-11 ENCOUNTER — Ambulatory Visit
Admission: RE | Admit: 2017-09-11 | Discharge: 2017-09-11 | Disposition: A | Discharge status: Home / Self Care | Payer: 59 | Source: Ambulatory Visit | Attending: Family Medicine | Admitting: Family Medicine

## 2017-09-11 ENCOUNTER — Other Ambulatory Visit: Payer: Self-pay | Admitting: Family Medicine

## 2017-09-11 DIAGNOSIS — R609 Edema, unspecified: Secondary | ICD-10-CM

## 2017-09-11 DIAGNOSIS — Z9013 Acquired absence of bilateral breasts and nipples: Secondary | ICD-10-CM

## 2017-09-11 DIAGNOSIS — Z853 Personal history of malignant neoplasm of breast: Secondary | ICD-10-CM

## 2017-09-11 DIAGNOSIS — N6489 Other specified disorders of breast: Secondary | ICD-10-CM | POA: Diagnosis not present

## 2017-10-03 DIAGNOSIS — M9903 Segmental and somatic dysfunction of lumbar region: Secondary | ICD-10-CM | POA: Diagnosis not present

## 2017-10-03 DIAGNOSIS — M5116 Intervertebral disc disorders with radiculopathy, lumbar region: Secondary | ICD-10-CM | POA: Diagnosis not present

## 2017-10-03 DIAGNOSIS — M62830 Muscle spasm of back: Secondary | ICD-10-CM | POA: Diagnosis not present

## 2017-10-10 ENCOUNTER — Ambulatory Visit
Admission: RE | Admit: 2017-10-10 | Discharge: 2017-10-10 | Disposition: A | Discharge status: Home / Self Care | Payer: 59 | Source: Ambulatory Visit | Attending: Family Medicine | Admitting: Family Medicine

## 2017-10-10 ENCOUNTER — Other Ambulatory Visit: Payer: Self-pay | Admitting: Family Medicine

## 2017-10-10 DIAGNOSIS — Z853 Personal history of malignant neoplasm of breast: Secondary | ICD-10-CM

## 2017-10-10 DIAGNOSIS — Z9013 Acquired absence of bilateral breasts and nipples: Secondary | ICD-10-CM

## 2017-10-10 DIAGNOSIS — N6489 Other specified disorders of breast: Secondary | ICD-10-CM | POA: Diagnosis not present

## 2017-10-10 DIAGNOSIS — R609 Edema, unspecified: Secondary | ICD-10-CM

## 2017-10-24 DIAGNOSIS — M5116 Intervertebral disc disorders with radiculopathy, lumbar region: Secondary | ICD-10-CM | POA: Diagnosis not present

## 2017-10-24 DIAGNOSIS — M62830 Muscle spasm of back: Secondary | ICD-10-CM | POA: Diagnosis not present

## 2017-10-24 DIAGNOSIS — M9903 Segmental and somatic dysfunction of lumbar region: Secondary | ICD-10-CM | POA: Diagnosis not present

## 2017-11-14 DIAGNOSIS — M9903 Segmental and somatic dysfunction of lumbar region: Secondary | ICD-10-CM | POA: Diagnosis not present

## 2017-11-14 DIAGNOSIS — M62830 Muscle spasm of back: Secondary | ICD-10-CM | POA: Diagnosis not present

## 2017-11-14 DIAGNOSIS — M5116 Intervertebral disc disorders with radiculopathy, lumbar region: Secondary | ICD-10-CM | POA: Diagnosis not present

## 2017-11-26 DIAGNOSIS — E119 Type 2 diabetes mellitus without complications: Secondary | ICD-10-CM | POA: Diagnosis not present

## 2017-11-26 DIAGNOSIS — I10 Essential (primary) hypertension: Secondary | ICD-10-CM | POA: Diagnosis not present

## 2017-11-26 DIAGNOSIS — G2581 Restless legs syndrome: Secondary | ICD-10-CM | POA: Diagnosis not present

## 2017-12-06 DIAGNOSIS — M5116 Intervertebral disc disorders with radiculopathy, lumbar region: Secondary | ICD-10-CM | POA: Diagnosis not present

## 2017-12-06 DIAGNOSIS — M9903 Segmental and somatic dysfunction of lumbar region: Secondary | ICD-10-CM | POA: Diagnosis not present

## 2017-12-06 DIAGNOSIS — M62830 Muscle spasm of back: Secondary | ICD-10-CM | POA: Diagnosis not present

## 2017-12-19 DIAGNOSIS — M5116 Intervertebral disc disorders with radiculopathy, lumbar region: Secondary | ICD-10-CM | POA: Diagnosis not present

## 2017-12-19 DIAGNOSIS — M62830 Muscle spasm of back: Secondary | ICD-10-CM | POA: Diagnosis not present

## 2017-12-19 DIAGNOSIS — M9903 Segmental and somatic dysfunction of lumbar region: Secondary | ICD-10-CM | POA: Diagnosis not present

## 2018-01-08 DIAGNOSIS — M62830 Muscle spasm of back: Secondary | ICD-10-CM | POA: Diagnosis not present

## 2018-01-08 DIAGNOSIS — M9903 Segmental and somatic dysfunction of lumbar region: Secondary | ICD-10-CM | POA: Diagnosis not present

## 2018-01-08 DIAGNOSIS — M5116 Intervertebral disc disorders with radiculopathy, lumbar region: Secondary | ICD-10-CM | POA: Diagnosis not present

## 2018-01-21 DIAGNOSIS — K5730 Diverticulosis of large intestine without perforation or abscess without bleeding: Secondary | ICD-10-CM | POA: Diagnosis not present

## 2018-01-21 DIAGNOSIS — Z1211 Encounter for screening for malignant neoplasm of colon: Secondary | ICD-10-CM | POA: Diagnosis not present

## 2018-01-29 DIAGNOSIS — M9903 Segmental and somatic dysfunction of lumbar region: Secondary | ICD-10-CM | POA: Diagnosis not present

## 2018-01-29 DIAGNOSIS — M62830 Muscle spasm of back: Secondary | ICD-10-CM | POA: Diagnosis not present

## 2018-01-29 DIAGNOSIS — M5116 Intervertebral disc disorders with radiculopathy, lumbar region: Secondary | ICD-10-CM | POA: Diagnosis not present

## 2018-02-01 DIAGNOSIS — H40002 Preglaucoma, unspecified, left eye: Secondary | ICD-10-CM | POA: Diagnosis not present

## 2018-02-28 DIAGNOSIS — M5116 Intervertebral disc disorders with radiculopathy, lumbar region: Secondary | ICD-10-CM | POA: Diagnosis not present

## 2018-02-28 DIAGNOSIS — M9903 Segmental and somatic dysfunction of lumbar region: Secondary | ICD-10-CM | POA: Diagnosis not present

## 2018-02-28 DIAGNOSIS — M62830 Muscle spasm of back: Secondary | ICD-10-CM | POA: Diagnosis not present

## 2018-03-21 DIAGNOSIS — M62830 Muscle spasm of back: Secondary | ICD-10-CM | POA: Diagnosis not present

## 2018-03-21 DIAGNOSIS — M9903 Segmental and somatic dysfunction of lumbar region: Secondary | ICD-10-CM | POA: Diagnosis not present

## 2018-03-21 DIAGNOSIS — M5116 Intervertebral disc disorders with radiculopathy, lumbar region: Secondary | ICD-10-CM | POA: Diagnosis not present

## 2018-04-01 DIAGNOSIS — L309 Dermatitis, unspecified: Secondary | ICD-10-CM | POA: Diagnosis not present

## 2018-04-01 DIAGNOSIS — E119 Type 2 diabetes mellitus without complications: Secondary | ICD-10-CM | POA: Diagnosis not present

## 2018-04-01 DIAGNOSIS — I10 Essential (primary) hypertension: Secondary | ICD-10-CM | POA: Diagnosis not present

## 2018-04-10 ENCOUNTER — Ambulatory Visit
Admission: RE | Admit: 2018-04-10 | Discharge: 2018-04-10 | Disposition: A | Discharge status: Home / Self Care | Payer: 59 | Source: Ambulatory Visit | Attending: Family Medicine | Admitting: Family Medicine

## 2018-04-10 DIAGNOSIS — N6489 Other specified disorders of breast: Secondary | ICD-10-CM | POA: Diagnosis not present

## 2018-04-10 DIAGNOSIS — Z853 Personal history of malignant neoplasm of breast: Secondary | ICD-10-CM

## 2018-04-10 DIAGNOSIS — R609 Edema, unspecified: Secondary | ICD-10-CM

## 2018-04-10 DIAGNOSIS — Z9013 Acquired absence of bilateral breasts and nipples: Secondary | ICD-10-CM

## 2018-04-11 DIAGNOSIS — M5116 Intervertebral disc disorders with radiculopathy, lumbar region: Secondary | ICD-10-CM | POA: Diagnosis not present

## 2018-04-11 DIAGNOSIS — M9903 Segmental and somatic dysfunction of lumbar region: Secondary | ICD-10-CM | POA: Diagnosis not present

## 2018-04-11 DIAGNOSIS — M62830 Muscle spasm of back: Secondary | ICD-10-CM | POA: Diagnosis not present

## 2018-05-02 DIAGNOSIS — M9903 Segmental and somatic dysfunction of lumbar region: Secondary | ICD-10-CM | POA: Diagnosis not present

## 2018-05-02 DIAGNOSIS — M62830 Muscle spasm of back: Secondary | ICD-10-CM | POA: Diagnosis not present

## 2018-05-02 DIAGNOSIS — M5116 Intervertebral disc disorders with radiculopathy, lumbar region: Secondary | ICD-10-CM | POA: Diagnosis not present

## 2018-05-23 DIAGNOSIS — M5116 Intervertebral disc disorders with radiculopathy, lumbar region: Secondary | ICD-10-CM | POA: Diagnosis not present

## 2018-05-23 DIAGNOSIS — M9903 Segmental and somatic dysfunction of lumbar region: Secondary | ICD-10-CM | POA: Diagnosis not present

## 2018-05-23 DIAGNOSIS — M62830 Muscle spasm of back: Secondary | ICD-10-CM | POA: Diagnosis not present

## 2018-06-12 DIAGNOSIS — M62830 Muscle spasm of back: Secondary | ICD-10-CM | POA: Diagnosis not present

## 2018-06-12 DIAGNOSIS — M5116 Intervertebral disc disorders with radiculopathy, lumbar region: Secondary | ICD-10-CM | POA: Diagnosis not present

## 2018-06-12 DIAGNOSIS — M9903 Segmental and somatic dysfunction of lumbar region: Secondary | ICD-10-CM | POA: Diagnosis not present

## 2018-07-04 DIAGNOSIS — M9903 Segmental and somatic dysfunction of lumbar region: Secondary | ICD-10-CM | POA: Diagnosis not present

## 2018-07-04 DIAGNOSIS — M62830 Muscle spasm of back: Secondary | ICD-10-CM | POA: Diagnosis not present

## 2018-07-04 DIAGNOSIS — M5116 Intervertebral disc disorders with radiculopathy, lumbar region: Secondary | ICD-10-CM | POA: Diagnosis not present

## 2018-07-17 DIAGNOSIS — Z853 Personal history of malignant neoplasm of breast: Secondary | ICD-10-CM | POA: Diagnosis not present

## 2018-07-17 DIAGNOSIS — N390 Urinary tract infection, site not specified: Secondary | ICD-10-CM | POA: Diagnosis not present

## 2018-07-17 DIAGNOSIS — Z8042 Family history of malignant neoplasm of prostate: Secondary | ICD-10-CM | POA: Diagnosis not present

## 2018-07-17 DIAGNOSIS — Z01419 Encounter for gynecological examination (general) (routine) without abnormal findings: Secondary | ICD-10-CM | POA: Diagnosis not present

## 2018-07-17 DIAGNOSIS — Z800 Family history of malignant neoplasm of digestive organs: Secondary | ICD-10-CM | POA: Diagnosis not present

## 2018-07-19 DIAGNOSIS — H04123 Dry eye syndrome of bilateral lacrimal glands: Secondary | ICD-10-CM | POA: Diagnosis not present

## 2018-07-19 DIAGNOSIS — E119 Type 2 diabetes mellitus without complications: Secondary | ICD-10-CM | POA: Diagnosis not present

## 2018-07-19 DIAGNOSIS — H40002 Preglaucoma, unspecified, left eye: Secondary | ICD-10-CM | POA: Diagnosis not present

## 2018-07-22 DIAGNOSIS — C50911 Malignant neoplasm of unspecified site of right female breast: Secondary | ICD-10-CM | POA: Diagnosis not present

## 2018-07-22 DIAGNOSIS — C50912 Malignant neoplasm of unspecified site of left female breast: Secondary | ICD-10-CM | POA: Diagnosis not present

## 2018-07-25 DIAGNOSIS — M62830 Muscle spasm of back: Secondary | ICD-10-CM | POA: Diagnosis not present

## 2018-07-25 DIAGNOSIS — C50911 Malignant neoplasm of unspecified site of right female breast: Secondary | ICD-10-CM | POA: Diagnosis not present

## 2018-07-25 DIAGNOSIS — M9903 Segmental and somatic dysfunction of lumbar region: Secondary | ICD-10-CM | POA: Diagnosis not present

## 2018-07-25 DIAGNOSIS — M5116 Intervertebral disc disorders with radiculopathy, lumbar region: Secondary | ICD-10-CM | POA: Diagnosis not present

## 2018-07-25 DIAGNOSIS — C50912 Malignant neoplasm of unspecified site of left female breast: Secondary | ICD-10-CM | POA: Diagnosis not present

## 2018-07-30 DIAGNOSIS — D225 Melanocytic nevi of trunk: Secondary | ICD-10-CM | POA: Diagnosis not present

## 2018-07-30 DIAGNOSIS — L720 Epidermal cyst: Secondary | ICD-10-CM | POA: Diagnosis not present

## 2018-07-30 DIAGNOSIS — D1801 Hemangioma of skin and subcutaneous tissue: Secondary | ICD-10-CM | POA: Diagnosis not present

## 2018-08-01 DIAGNOSIS — E785 Hyperlipidemia, unspecified: Secondary | ICD-10-CM | POA: Diagnosis not present

## 2018-08-01 DIAGNOSIS — Z0000 Encounter for general adult medical examination without abnormal findings: Secondary | ICD-10-CM | POA: Diagnosis not present

## 2018-08-06 DIAGNOSIS — C50912 Malignant neoplasm of unspecified site of left female breast: Secondary | ICD-10-CM | POA: Diagnosis not present

## 2018-08-06 DIAGNOSIS — C50911 Malignant neoplasm of unspecified site of right female breast: Secondary | ICD-10-CM | POA: Diagnosis not present

## 2018-08-15 DIAGNOSIS — M62830 Muscle spasm of back: Secondary | ICD-10-CM | POA: Diagnosis not present

## 2018-08-15 DIAGNOSIS — M9903 Segmental and somatic dysfunction of lumbar region: Secondary | ICD-10-CM | POA: Diagnosis not present

## 2018-08-15 DIAGNOSIS — M5116 Intervertebral disc disorders with radiculopathy, lumbar region: Secondary | ICD-10-CM | POA: Diagnosis not present

## 2018-09-02 DIAGNOSIS — Z809 Family history of malignant neoplasm, unspecified: Secondary | ICD-10-CM | POA: Diagnosis not present

## 2018-09-05 DIAGNOSIS — M62830 Muscle spasm of back: Secondary | ICD-10-CM | POA: Diagnosis not present

## 2018-09-05 DIAGNOSIS — M5116 Intervertebral disc disorders with radiculopathy, lumbar region: Secondary | ICD-10-CM | POA: Diagnosis not present

## 2018-09-05 DIAGNOSIS — M9903 Segmental and somatic dysfunction of lumbar region: Secondary | ICD-10-CM | POA: Diagnosis not present

## 2018-09-26 DIAGNOSIS — M9903 Segmental and somatic dysfunction of lumbar region: Secondary | ICD-10-CM | POA: Diagnosis not present

## 2018-09-26 DIAGNOSIS — M62830 Muscle spasm of back: Secondary | ICD-10-CM | POA: Diagnosis not present

## 2018-09-26 DIAGNOSIS — M5116 Intervertebral disc disorders with radiculopathy, lumbar region: Secondary | ICD-10-CM | POA: Diagnosis not present

## 2018-10-17 DIAGNOSIS — M9903 Segmental and somatic dysfunction of lumbar region: Secondary | ICD-10-CM | POA: Diagnosis not present

## 2018-10-17 DIAGNOSIS — M62830 Muscle spasm of back: Secondary | ICD-10-CM | POA: Diagnosis not present

## 2018-10-17 DIAGNOSIS — M5116 Intervertebral disc disorders with radiculopathy, lumbar region: Secondary | ICD-10-CM | POA: Diagnosis not present

## 2018-11-01 DIAGNOSIS — I10 Essential (primary) hypertension: Secondary | ICD-10-CM | POA: Diagnosis not present

## 2018-11-01 DIAGNOSIS — E119 Type 2 diabetes mellitus without complications: Secondary | ICD-10-CM | POA: Diagnosis not present

## 2018-12-26 DIAGNOSIS — M5116 Intervertebral disc disorders with radiculopathy, lumbar region: Secondary | ICD-10-CM | POA: Diagnosis not present

## 2018-12-26 DIAGNOSIS — M62830 Muscle spasm of back: Secondary | ICD-10-CM | POA: Diagnosis not present

## 2018-12-26 DIAGNOSIS — M9903 Segmental and somatic dysfunction of lumbar region: Secondary | ICD-10-CM | POA: Diagnosis not present

## 2019-02-10 DIAGNOSIS — M25561 Pain in right knee: Secondary | ICD-10-CM | POA: Diagnosis not present

## 2019-02-10 DIAGNOSIS — M25562 Pain in left knee: Secondary | ICD-10-CM | POA: Diagnosis not present

## 2019-02-10 DIAGNOSIS — M25552 Pain in left hip: Secondary | ICD-10-CM | POA: Diagnosis not present

## 2019-02-11 DIAGNOSIS — I10 Essential (primary) hypertension: Secondary | ICD-10-CM | POA: Diagnosis not present

## 2019-02-11 DIAGNOSIS — E119 Type 2 diabetes mellitus without complications: Secondary | ICD-10-CM | POA: Diagnosis not present

## 2019-03-11 DIAGNOSIS — M545 Low back pain: Secondary | ICD-10-CM | POA: Diagnosis not present

## 2019-03-11 DIAGNOSIS — M25552 Pain in left hip: Secondary | ICD-10-CM | POA: Diagnosis not present

## 2019-03-13 ENCOUNTER — Other Ambulatory Visit: Payer: Self-pay | Admitting: Orthopedic Surgery

## 2019-03-13 DIAGNOSIS — M545 Low back pain: Secondary | ICD-10-CM | POA: Diagnosis not present

## 2019-03-13 DIAGNOSIS — M25552 Pain in left hip: Secondary | ICD-10-CM

## 2019-03-18 ENCOUNTER — Other Ambulatory Visit: Payer: Self-pay

## 2019-03-18 ENCOUNTER — Ambulatory Visit
Admission: RE | Admit: 2019-03-18 | Discharge: 2019-03-18 | Disposition: A | Discharge status: Home / Self Care | Payer: 59 | Source: Ambulatory Visit | Attending: Orthopedic Surgery | Admitting: Orthopedic Surgery

## 2019-03-18 DIAGNOSIS — M545 Low back pain: Secondary | ICD-10-CM | POA: Diagnosis not present

## 2019-03-18 DIAGNOSIS — M25552 Pain in left hip: Secondary | ICD-10-CM | POA: Diagnosis not present

## 2019-03-20 DIAGNOSIS — M25552 Pain in left hip: Secondary | ICD-10-CM | POA: Diagnosis not present

## 2019-03-25 DIAGNOSIS — M62830 Muscle spasm of back: Secondary | ICD-10-CM | POA: Diagnosis not present

## 2019-03-25 DIAGNOSIS — M9903 Segmental and somatic dysfunction of lumbar region: Secondary | ICD-10-CM | POA: Diagnosis not present

## 2019-03-25 DIAGNOSIS — M5116 Intervertebral disc disorders with radiculopathy, lumbar region: Secondary | ICD-10-CM | POA: Diagnosis not present

## 2019-04-15 DIAGNOSIS — M5116 Intervertebral disc disorders with radiculopathy, lumbar region: Secondary | ICD-10-CM | POA: Diagnosis not present

## 2019-04-15 DIAGNOSIS — M9903 Segmental and somatic dysfunction of lumbar region: Secondary | ICD-10-CM | POA: Diagnosis not present

## 2019-04-15 DIAGNOSIS — M62830 Muscle spasm of back: Secondary | ICD-10-CM | POA: Diagnosis not present

## 2019-04-17 DIAGNOSIS — M25552 Pain in left hip: Secondary | ICD-10-CM | POA: Diagnosis not present

## 2019-04-23 DIAGNOSIS — M25551 Pain in right hip: Secondary | ICD-10-CM | POA: Diagnosis not present

## 2019-09-25 ENCOUNTER — Ambulatory Visit: Payer: 59 | Admitting: Vascular Surgery
